# Patient Record
Sex: Female | Born: 1965 | Race: Black or African American | Hispanic: No | Marital: Single | Smoking: Current every day smoker
Health system: Southern US, Community
[De-identification: ages and names within clinical notes are randomized; demographics above are authoritative.]

## PROBLEM LIST (undated history)

## (undated) DIAGNOSIS — J45909 Unspecified asthma, uncomplicated: Secondary | ICD-10-CM

## (undated) DIAGNOSIS — F319 Bipolar disorder, unspecified: Secondary | ICD-10-CM

## (undated) DIAGNOSIS — N898 Other specified noninflammatory disorders of vagina: Secondary | ICD-10-CM

## (undated) DIAGNOSIS — F99 Mental disorder, not otherwise specified: Secondary | ICD-10-CM

## (undated) DIAGNOSIS — J449 Chronic obstructive pulmonary disease, unspecified: Secondary | ICD-10-CM

## (undated) DIAGNOSIS — R232 Flushing: Secondary | ICD-10-CM

## (undated) DIAGNOSIS — D219 Benign neoplasm of connective and other soft tissue, unspecified: Secondary | ICD-10-CM

## (undated) DIAGNOSIS — I10 Essential (primary) hypertension: Secondary | ICD-10-CM

## (undated) HISTORY — DX: Flushing: R23.2

## (undated) HISTORY — PX: COLONOSCOPY: SHX174

## (undated) HISTORY — DX: Bipolar disorder, unspecified: F31.9

## (undated) HISTORY — DX: Benign neoplasm of connective and other soft tissue, unspecified: D21.9

## (undated) HISTORY — PX: ABDOMINAL HYSTERECTOMY: SHX81

## (undated) HISTORY — DX: Mental disorder, not otherwise specified: F99

## (undated) HISTORY — DX: Unspecified asthma, uncomplicated: J45.909

## (undated) HISTORY — DX: Other specified noninflammatory disorders of vagina: N89.8

## (undated) HISTORY — DX: Chronic obstructive pulmonary disease, unspecified: J44.9

---

## 2001-12-12 ENCOUNTER — Emergency Department (HOSPITAL_COMMUNITY): Admission: EM | Admit: 2001-12-12 | Discharge: 2001-12-13 | Payer: Self-pay | Admitting: *Deleted

## 2002-01-02 ENCOUNTER — Ambulatory Visit (HOSPITAL_COMMUNITY): Admission: RE | Admit: 2002-01-02 | Discharge: 2002-01-02 | Payer: Self-pay | Admitting: Family Medicine

## 2002-01-02 ENCOUNTER — Encounter: Payer: Self-pay | Admitting: Family Medicine

## 2002-01-13 ENCOUNTER — Ambulatory Visit (HOSPITAL_COMMUNITY): Admission: RE | Admit: 2002-01-13 | Discharge: 2002-01-13 | Payer: Self-pay | Admitting: Cardiovascular Disease

## 2002-01-13 ENCOUNTER — Encounter: Payer: Self-pay | Admitting: Cardiovascular Disease

## 2004-03-05 ENCOUNTER — Ambulatory Visit (HOSPITAL_COMMUNITY): Admission: RE | Admit: 2004-03-05 | Discharge: 2004-03-05 | Payer: Self-pay | Admitting: Family Medicine

## 2005-11-06 ENCOUNTER — Ambulatory Visit (HOSPITAL_COMMUNITY): Admission: RE | Admit: 2005-11-06 | Discharge: 2005-11-06 | Payer: Self-pay | Admitting: Family Medicine

## 2007-01-28 ENCOUNTER — Ambulatory Visit (HOSPITAL_COMMUNITY): Admission: RE | Admit: 2007-01-28 | Discharge: 2007-01-28 | Payer: Self-pay | Admitting: Family Medicine

## 2007-02-01 ENCOUNTER — Ambulatory Visit (HOSPITAL_COMMUNITY): Admission: RE | Admit: 2007-02-01 | Discharge: 2007-02-01 | Payer: Self-pay | Admitting: Family Medicine

## 2007-03-08 ENCOUNTER — Ambulatory Visit (HOSPITAL_COMMUNITY): Admission: RE | Admit: 2007-03-08 | Discharge: 2007-03-08 | Payer: Self-pay | Admitting: Family Medicine

## 2009-08-30 ENCOUNTER — Ambulatory Visit (HOSPITAL_COMMUNITY): Admission: RE | Admit: 2009-08-30 | Discharge: 2009-08-30 | Payer: Self-pay | Admitting: Family Medicine

## 2010-05-27 ENCOUNTER — Ambulatory Visit (HOSPITAL_COMMUNITY)
Admission: RE | Admit: 2010-05-27 | Discharge: 2010-05-27 | Payer: Self-pay | Source: Home / Self Care | Attending: Family Medicine | Admitting: Family Medicine

## 2011-02-03 ENCOUNTER — Ambulatory Visit (HOSPITAL_COMMUNITY)
Admission: RE | Admit: 2011-02-03 | Discharge: 2011-02-03 | Disposition: A | Discharge status: Home / Self Care | Payer: BC Managed Care – PPO | Source: Ambulatory Visit | Attending: Family Medicine | Admitting: Family Medicine

## 2011-02-03 ENCOUNTER — Other Ambulatory Visit (HOSPITAL_COMMUNITY): Payer: Self-pay | Admitting: Family Medicine

## 2011-02-03 DIAGNOSIS — R05 Cough: Secondary | ICD-10-CM | POA: Insufficient documentation

## 2011-02-03 DIAGNOSIS — F17200 Nicotine dependence, unspecified, uncomplicated: Secondary | ICD-10-CM | POA: Insufficient documentation

## 2011-02-03 DIAGNOSIS — Z01419 Encounter for gynecological examination (general) (routine) without abnormal findings: Secondary | ICD-10-CM

## 2011-02-03 DIAGNOSIS — R059 Cough, unspecified: Secondary | ICD-10-CM | POA: Insufficient documentation

## 2011-07-28 ENCOUNTER — Inpatient Hospital Stay (HOSPITAL_COMMUNITY)
Admission: EM | Admit: 2011-07-28 | Discharge: 2011-07-30 | DRG: 640 | Disposition: A | Discharge status: Home / Self Care | Payer: Self-pay | Attending: Internal Medicine | Admitting: Internal Medicine

## 2011-07-28 ENCOUNTER — Emergency Department (HOSPITAL_COMMUNITY): Payer: Self-pay

## 2011-07-28 ENCOUNTER — Other Ambulatory Visit: Payer: Self-pay

## 2011-07-28 ENCOUNTER — Encounter (HOSPITAL_COMMUNITY): Payer: Self-pay | Admitting: *Deleted

## 2011-07-28 DIAGNOSIS — F17200 Nicotine dependence, unspecified, uncomplicated: Secondary | ICD-10-CM | POA: Diagnosis present

## 2011-07-28 DIAGNOSIS — R55 Syncope and collapse: Secondary | ICD-10-CM | POA: Diagnosis present

## 2011-07-28 DIAGNOSIS — N390 Urinary tract infection, site not specified: Secondary | ICD-10-CM | POA: Diagnosis present

## 2011-07-28 DIAGNOSIS — I10 Essential (primary) hypertension: Secondary | ICD-10-CM | POA: Diagnosis present

## 2011-07-28 DIAGNOSIS — W19XXXA Unspecified fall, initial encounter: Secondary | ICD-10-CM | POA: Diagnosis present

## 2011-07-28 DIAGNOSIS — R109 Unspecified abdominal pain: Secondary | ICD-10-CM | POA: Diagnosis present

## 2011-07-28 DIAGNOSIS — E860 Dehydration: Secondary | ICD-10-CM | POA: Diagnosis present

## 2011-07-28 DIAGNOSIS — S0003XA Contusion of scalp, initial encounter: Secondary | ICD-10-CM | POA: Diagnosis present

## 2011-07-28 DIAGNOSIS — S1093XA Contusion of unspecified part of neck, initial encounter: Secondary | ICD-10-CM | POA: Diagnosis present

## 2011-07-28 DIAGNOSIS — D72829 Elevated white blood cell count, unspecified: Secondary | ICD-10-CM | POA: Diagnosis present

## 2011-07-28 DIAGNOSIS — R000 Tachycardia, unspecified: Secondary | ICD-10-CM | POA: Diagnosis present

## 2011-07-28 DIAGNOSIS — R112 Nausea with vomiting, unspecified: Secondary | ICD-10-CM | POA: Diagnosis present

## 2011-07-28 DIAGNOSIS — E871 Hypo-osmolality and hyponatremia: Principal | ICD-10-CM | POA: Diagnosis present

## 2011-07-28 DIAGNOSIS — J189 Pneumonia, unspecified organism: Secondary | ICD-10-CM | POA: Diagnosis present

## 2011-07-28 HISTORY — DX: Essential (primary) hypertension: I10

## 2011-07-28 LAB — RAPID URINE DRUG SCREEN, HOSP PERFORMED
Amphetamines: NOT DETECTED
Barbiturates: NOT DETECTED
Benzodiazepines: NOT DETECTED
Cocaine: NOT DETECTED
Opiates: NOT DETECTED
Tetrahydrocannabinol: NOT DETECTED

## 2011-07-28 LAB — CARDIAC PANEL(CRET KIN+CKTOT+MB+TROPI)
CK, MB: 1.2 ng/mL (ref 0.3–4.0)
Relative Index: 0.5 (ref 0.0–2.5)
Total CK: 235 U/L — ABNORMAL HIGH (ref 7–177)

## 2011-07-28 LAB — URINALYSIS, ROUTINE W REFLEX MICROSCOPIC
Glucose, UA: 100 mg/dL — AB
Ketones, ur: 15 mg/dL — AB
Leukocytes, UA: NEGATIVE
Nitrite: POSITIVE — AB
Protein, ur: 100 mg/dL — AB
Specific Gravity, Urine: 1.01 (ref 1.005–1.030)
Urobilinogen, UA: 1 mg/dL (ref 0.0–1.0)
pH: 6.5 (ref 5.0–8.0)

## 2011-07-28 LAB — COMPREHENSIVE METABOLIC PANEL
ALT: 28 U/L (ref 0–35)
AST: 49 U/L — ABNORMAL HIGH (ref 0–37)
Albumin: 4 g/dL (ref 3.5–5.2)
Calcium: 10.7 mg/dL — ABNORMAL HIGH (ref 8.4–10.5)
Chloride: 90 mEq/L — ABNORMAL LOW (ref 96–112)
Creatinine, Ser: 0.71 mg/dL (ref 0.50–1.10)
GFR calc Af Amer: >90 mL/min (ref >90)
GFR calc non Af Amer: >90 mL/min (ref >90)
Sodium: 129 mEq/L — ABNORMAL LOW (ref 135–145)
Total Bilirubin: 0.8 mg/dL (ref 0.3–1.2)
Total Protein: 8.1 g/dL (ref 6.0–8.3)

## 2011-07-28 LAB — CBC
HCT: 42.1 % (ref 36.0–46.0)
Hemoglobin: 15 g/dL (ref 12.0–15.0)
MCH: 34.1 pg — ABNORMAL HIGH (ref 26.0–34.0)
MCHC: 35.6 g/dL (ref 30.0–36.0)
MCV: 95.7 fL (ref 78.0–100.0)
Platelets: 206 K/uL (ref 150–400)
RBC: 4.4 MIL/uL (ref 3.87–5.11)
RDW: 12.5 % (ref 11.5–15.5)
WBC: 15.5 K/uL — ABNORMAL HIGH (ref 4.0–10.5)

## 2011-07-28 LAB — URINE MICROSCOPIC-ADD ON

## 2011-07-28 LAB — DIFFERENTIAL
Basophils Absolute: 0 K/uL (ref 0.0–0.1)
Basophils Relative: 0 % (ref 0–1)
Eosinophils Absolute: 0 K/uL (ref 0.0–0.7)
Eosinophils Relative: 0 % (ref 0–5)
Lymphocytes Relative: 8 % — ABNORMAL LOW (ref 12–46)
Lymphs Abs: 1.3 K/uL (ref 0.7–4.0)
Monocytes Absolute: 1.3 K/uL — ABNORMAL HIGH (ref 0.1–1.0)
Monocytes Relative: 8 % (ref 3–12)
Neutro Abs: 12.9 K/uL — ABNORMAL HIGH (ref 1.7–7.7)
Neutrophils Relative %: 83 % — ABNORMAL HIGH (ref 43–77)

## 2011-07-28 LAB — D-DIMER, QUANTITATIVE: D-Dimer, Quant: 1.55 ug/mL-FEU — ABNORMAL HIGH (ref 0.00–0.48)

## 2011-07-28 MED ORDER — METOPROLOL TARTRATE 1 MG/ML IV SOLN
5.0000 mg | INTRAVENOUS | Status: DC | PRN
  Administered 2011-07-28: 5 mg via INTRAVENOUS
  Filled 2011-07-28: qty 5

## 2011-07-28 MED ORDER — SODIUM CHLORIDE 0.9 % IV BOLUS (SEPSIS)
1000.0000 mL | Freq: Once | INTRAVENOUS | Status: AC
  Administered 2011-07-28: 1000 mL via INTRAVENOUS

## 2011-07-28 MED ORDER — SODIUM CHLORIDE 0.9 % IV SOLN
INTRAVENOUS | Status: DC
  Administered 2011-07-28: 100 mL via INTRAVENOUS
  Administered 2011-07-30: 08:00:00 via INTRAVENOUS

## 2011-07-28 MED ORDER — ONDANSETRON HCL 4 MG/2ML IJ SOLN: 4.0000 mg | Freq: Four times a day (QID) | INTRAMUSCULAR | Status: DC | PRN

## 2011-07-28 MED ORDER — ASPIRIN EC 325 MG PO TBEC
325.0000 mg | DELAYED_RELEASE_TABLET | Freq: Every day | ORAL | Status: DC
  Administered 2011-07-28 – 2011-07-30 (×3): 325 mg via ORAL
  Filled 2011-07-28 (×3): qty 1

## 2011-07-28 MED ORDER — MORPHINE SULFATE 4 MG/ML IJ SOLN
4.0000 mg | Freq: Once | INTRAMUSCULAR | Status: AC
  Administered 2011-07-28: 4 mg via INTRAVENOUS
  Filled 2011-07-28: qty 1

## 2011-07-28 MED ORDER — SODIUM CHLORIDE 0.9 % IV SOLN: INTRAVENOUS | Status: AC

## 2011-07-28 MED ORDER — SODIUM CHLORIDE 0.9 % IV SOLN
Freq: Once | INTRAVENOUS | Status: AC
  Administered 2011-07-28: 16:00:00 via INTRAVENOUS

## 2011-07-28 MED ORDER — ACETAMINOPHEN 325 MG PO TABS
650.0000 mg | ORAL_TABLET | Freq: Four times a day (QID) | ORAL | Status: DC | PRN
  Administered 2011-07-28 – 2011-07-30 (×3): 650 mg via ORAL
  Filled 2011-07-28 (×3): qty 2

## 2011-07-28 MED ORDER — ONDANSETRON HCL 4 MG/2ML IJ SOLN
4.0000 mg | Freq: Once | INTRAMUSCULAR | Status: AC
  Administered 2011-07-28: 4 mg via INTRAVENOUS
  Filled 2011-07-28: qty 2

## 2011-07-28 MED ORDER — ALBUTEROL SULFATE HFA 108 (90 BASE) MCG/ACT IN AERS
2.0000 | INHALATION_SPRAY | Freq: Four times a day (QID) | RESPIRATORY_TRACT | Status: DC | PRN
  Filled 2011-07-28: qty 6.7

## 2011-07-28 MED ORDER — DEXTROSE 5 % IV SOLN
1.0000 g | INTRAVENOUS | Status: DC
  Administered 2011-07-28 – 2011-07-29 (×2): 1 g via INTRAVENOUS
  Filled 2011-07-28 (×3): qty 10

## 2011-07-28 MED ORDER — ENOXAPARIN SODIUM 40 MG/0.4ML ~~LOC~~ SOLN
40.0000 mg | SUBCUTANEOUS | Status: DC
  Administered 2011-07-28 – 2011-07-29 (×2): 40 mg via SUBCUTANEOUS
  Filled 2011-07-28 (×2): qty 0.4

## 2011-07-28 MED ORDER — POTASSIUM CHLORIDE CRYS ER 20 MEQ PO TBCR
40.0000 meq | EXTENDED_RELEASE_TABLET | Freq: Once | ORAL | Status: AC
  Administered 2011-07-28: 40 meq via ORAL
  Filled 2011-07-28: qty 2

## 2011-07-28 MED ORDER — ACETAMINOPHEN 650 MG RE SUPP: 650.0000 mg | Freq: Four times a day (QID) | RECTAL | Status: DC | PRN

## 2011-07-28 MED ORDER — MORPHINE SULFATE 2 MG/ML IJ SOLN: 2.0000 mg | INTRAMUSCULAR | Status: DC | PRN

## 2011-07-28 MED ORDER — ONDANSETRON HCL 4 MG PO TABS: 4.0000 mg | ORAL_TABLET | Freq: Four times a day (QID) | ORAL | Status: DC | PRN

## 2011-07-28 MED ORDER — DEXTROSE 5 % IV SOLN
INTRAVENOUS | Status: AC
  Filled 2011-07-28: qty 10

## 2011-07-28 NOTE — ED Provider Notes (Signed)
History   This chart was scribed for [employer]. Colon Branch, MD by Clarita Crane. The patient was seen in room APA08/APA08. Patient's care was started at 1221.    CSN: [id number]  Arrival date & time 07/28/11  1221   First MD Initiated Contact with Patient 07/28/11 1322      Chief Complaint  Patient presents with  . Loss of Consciousness    (Consider location/radiation/quality/duration/timing/severity/associated sxs/prior treatment) HPI Bethany Espinoza is a 46 y.o. female who presents to the Emergency Department complaining of 2 syncopal episode which occurred immediately following sudden onset of severe weakness 2 days ago. Reports she experienced one episode while in the bathroom and another several minutes later. Also notes experiencing chills for the past several days. Patient notes history of 1 syncopal episode previously. Additionally, patient notes experiencing cough for the past 2 weeks. Denies fever, nausea, vomiting, diarrhea, dizziness, HA. Patient with h/o HTN.   PCP- Phillips Odor   Past Medical History  Diagnosis Date  . Hypertension     Past Surgical History  Procedure Date  . Abdominal hysterectomy     History reviewed. No pertinent family history.  History  Substance Use Topics  . Smoking status: Current Everyday Smoker  . Smokeless tobacco: Not on file  . Alcohol Use: Yes    OB History    Grav Para Term Preterm Abortions TAB SAB Ect Mult Living                  Review of Systems 10 Systems reviewed and are negative for acute change except as noted in the HPI.  Allergies  Codeine  Home Medications   Current Outpatient Rx  Name Route Sig Dispense Refill  . ALBUTEROL SULFATE HFA 108 (90 BASE) MCG/ACT IN AERS Inhalation Inhale 2 puffs into the lungs every 6 (six) hours as needed. Shortness of breath    . GOODYS BODY PAIN PO Oral Take 1 packet by mouth daily as needed. Pain      BP 137/93  Pulse 148  Temp(Src) 99.9 F (37.7 C) (Oral)  Resp 20  Ht 5'  6" (1.676 m)  Wt 130 lb (58.968 kg)  BMI 20.98 kg/m2  SpO2 99%  Physical Exam  Nursing note and vitals reviewed. Constitutional: She is oriented to person, place, and time. She appears well-developed and well-nourished. No distress.  HENT:  Head: Normocephalic.       Bruising and swelling to the left periorbital area  Eyes: Conjunctivae and EOM are normal. Pupils are equal, round, and reactive to light.  Neck: Neck supple. No tracheal deviation present.  Cardiovascular: Regular rhythm.  Tachycardia present.  Exam reveals no gallop and no friction rub.   No murmur heard. Pulmonary/Chest: Effort normal. No respiratory distress. She has wheezes.       Crackles at bases and occasional end expiratory wheeze.   Abdominal: Soft. She exhibits no distension.  Musculoskeletal: Normal range of motion. She exhibits no edema.  Neurological: She is alert and oriented to person, place, and time. No sensory deficit.  Skin: Skin is warm and dry.  Psychiatric: She has a normal mood and affect. Her behavior is normal.    ED Course  Procedures (including critical care time)  DIAGNOSTIC STUDIES: Oxygen Saturation is 99% on room air, normal by my interpretation.    COORDINATION OF CARE: 1:40PM- Patient informed of current plan for treatment and evaluation and agrees with plan at this time.  2:40 Receiving IVF. HR 116.  3:40 Resting quietly.  Reviewed results with patient. HR has responded to IVF but is not remaining below 100. 4:45 Spoke with Dr. Kerry Hough, hospitalist who will admit the patient to telemetry. Temporary orders have been completed. Results for orders placed during the hospital encounter of 07/28/11  CBC      Component Value Range   WBC 15.5 (*) 4.0 - 10.5 (K/uL)   RBC 4.40  3.87 - 5.11 (MIL/uL)   Hemoglobin 15.0  12.0 - 15.0 (g/dL)   HCT 81.1  91.4 - 78.2 (%)   MCV 95.7  78.0 - 100.0 (fL)   MCH 34.1 (*) 26.0 - 34.0 (pg)   MCHC 35.6  30.0 - 36.0 (g/dL)   RDW 95.6  21.3 - 08.6 (%)    Platelets 206  150 - 400 (K/uL)  DIFFERENTIAL      Component Value Range   Neutrophils Relative 83 (*) 43 - 77 (%)   Neutro Abs 12.9 (*) 1.7 - 7.7 (K/uL)   Lymphocytes Relative 8 (*) 12 - 46 (%)   Lymphs Abs 1.3  0.7 - 4.0 (K/uL)   Monocytes Relative 8  3 - 12 (%)   Monocytes Absolute 1.3 (*) 0.1 - 1.0 (K/uL)   Eosinophils Relative 0  0 - 5 (%)   Eosinophils Absolute 0.0  0.0 - 0.7 (K/uL)   Basophils Relative 0  0 - 1 (%)   Basophils Absolute 0.0  0.0 - 0.1 (K/uL)  COMPREHENSIVE METABOLIC PANEL      Component Value Range   Sodium 129 (*) 135 - 145 (mEq/L)   Potassium 3.5  3.5 - 5.1 (mEq/L)   Chloride 90 (*) 96 - 112 (mEq/L)   CO2 24  19 - 32 (mEq/L)   Glucose, Bld 128 (*) 70 - 99 (mg/dL)   BUN 4 (*) 6 - 23 (mg/dL)   Creatinine, Ser 5.78  0.50 - 1.10 (mg/dL)   Calcium 46.9 (*) 8.4 - 10.5 (mg/dL)   Total Protein 8.1  6.0 - 8.3 (g/dL)   Albumin 4.0  3.5 - 5.2 (g/dL)   AST 49 (*) 0 - 37 (U/L)   ALT 28  0 - 35 (U/L)   Alkaline Phosphatase 78  39 - 117 (U/L)   Total Bilirubin 0.8  0.3 - 1.2 (mg/dL)   GFR calc non Af Amer >90  >90 (mL/min)   GFR calc Af Amer >90  >90 (mL/min)  URINALYSIS, ROUTINE W REFLEX MICROSCOPIC      Component Value Range   Color, Urine AMBER (*) YELLOW    APPearance CLEAR  CLEAR    Specific Gravity, Urine 1.010  1.005 - 1.030    pH 6.5  5.0 - 8.0    Glucose, UA 100 (*) NEGATIVE (mg/dL)   Hgb urine dipstick TRACE (*) NEGATIVE    Bilirubin Urine MODERATE (*) NEGATIVE    Ketones, ur 15 (*) NEGATIVE (mg/dL)   Protein, ur 629 (*) NEGATIVE (mg/dL)   Urobilinogen, UA 1.0  0.0 - 1.0 (mg/dL)   Nitrite POSITIVE (*) NEGATIVE    Leukocytes, UA NEGATIVE  NEGATIVE   URINE MICROSCOPIC-ADD ON      Component Value Range   Squamous Epithelial / LPF MANY (*) RARE    WBC, UA 3-6  <3 (WBC/hpf)   RBC / HPF 0-2  <3 (RBC/hpf)   Bacteria, UA FEW (*) RARE    Casts GRANULAR CAST (*) NEGATIVE     Date: 07/28/2011  1326   Rate: 132  Rhythm: sinus tachycardia  QRS Axis:  normal  Intervals: normal  ST/T Wave abnormalities: normal  Conduction Disutrbances:none  Narrative Interpretation:   Old EKG Reviewed: none available  Ct Head Wo Contrast  07/28/2011  *RADIOLOGY REPORT*  Clinical Data:  Loss of consciousness, fall 2 days ago, forehead injury, left frontal orbital bruising.  CT HEAD WITHOUT CONTRAST CT CERVICAL SPINE WITHOUT CONTRAST  Technique:  Multidetector CT imaging of the head and cervical spine was performed following the standard protocol without intravenous contrast.  Multiplanar CT image reconstructions of the cervical spine were also generated.  Comparison:   None  CT HEAD  Findings: Small to moderate left anterior frontal scalp hematoma. Slight soft tissue swelling.  No acute intracranial hemorrhage, infarction, mass lesion, midline shift, herniation, hydrocephalus, or extra-axial fluid collection.  Cisterns patent.  No cerebellar abnormality.  Orbits symmetric.  Mastoids and visualized sinuses clear.  IMPRESSION: Left frontal scalp hematoma.  No acute intracranial finding or underlying skull fracture.  CT CERVICAL SPINE  Findings: Normal cervical spine alignment.  Preserved vertebral body heights and disc spaces.  No significant degenerative disc disease and spondylosis.  Normal prevertebral soft tissues. Negative for fracture, compression deformity, or focal kyphosis. Very minor facet arthropathy on the right at C2-3.  IMPRESSION: No acute fracture or injury of the cervical spine by CT.  Original Report Authenticated By: Judie Petit. Ruel Favors, M.D.   Ct Cervical Spine Wo Contrast  07/28/2011  *RADIOLOGY REPORT*  Clinical Data:  Loss of consciousness, fall 2 days ago, forehead injury, left frontal orbital bruising.  CT HEAD WITHOUT CONTRAST CT CERVICAL SPINE WITHOUT CONTRAST  Technique:  Multidetector CT imaging of the head and cervical spine was performed following the standard protocol without intravenous contrast.  Multiplanar CT image reconstructions of the  cervical spine were also generated.  Comparison:   None  CT HEAD  Findings: Small to moderate left anterior frontal scalp hematoma. Slight soft tissue swelling.  No acute intracranial hemorrhage, infarction, mass lesion, midline shift, herniation, hydrocephalus, or extra-axial fluid collection.  Cisterns patent.  No cerebellar abnormality.  Orbits symmetric.  Mastoids and visualized sinuses clear.  IMPRESSION: Left frontal scalp hematoma.  No acute intracranial finding or underlying skull fracture.  CT CERVICAL SPINE  Findings: Normal cervical spine alignment.  Preserved vertebral body heights and disc spaces.  No significant degenerative disc disease and spondylosis.  Normal prevertebral soft tissues. Negative for fracture, compression deformity, or focal kyphosis. Very minor facet arthropathy on the right at C2-3.  IMPRESSION: No acute fracture or injury of the cervical spine by CT.  Original Report Authenticated By: Judie Petit. Ruel Favors, M.D.   Dg Chest Port 1 View  07/28/2011  *RADIOLOGY REPORT*  Clinical Data: Syncopal episode 3 days ago.  PORTABLE CHEST - 1 VIEW  Comparison: 02/03/2011.  Findings: Minimal peribronchial thickening.  No segmental consolidation or pulmonary edema.  Pleural reflection associated undersurface left second rib.  No gross pneumothorax.  Heart size within normal limits.  IMPRESSION: Peribronchial thickening stable.  Original Report Authenticated By: Fuller Canada, M.D.           MDM  Patient with recent syncopal episodes x 2. Here with tachycardia. Labs showing hyponatremia, urine nitrite  positive with moderate bilirubin and no h/o vomiting.Chest xray without bronchitic changes or pna despite a harsh cough. Remains tachycardic despite 3 liters IVF. Will arrange for admission for further work up of both tachycardia and syncope.Pt stable in ED with no significant deterioration in condition.The patient appears reasonably stabilized for admission considering the current  resources, flow,  and capabilities available in the ED at this time, and I doubt any other [facility] requiring further screening and/or treatment in the ED prior to admission.  I personally performed the services described in this documentation, which was scribed in my presence. The recorded information has been reviewed and considered.   MDM Reviewed: nursing note and vitals Interpretation: labs, ECG, x-ray and CT scan  consult: hospitalist         Nicoletta Dress. Colon Branch, MD 07/28/11 1651

## 2011-07-28 NOTE — H&P (Addendum)
PCP:   Colette Ribas, MD, MD   Chief Complaint:  syncope  HPI: This is a 46 year old black female with past medical history of hypertension who was brought to the emergency room after a syncopal episode. Syncopal episodes occurred approximately 2 days ago. Patient reports that on Sunday she was in her bathroom started to feel increasingly weak and passed out. She then woke up went to the kitchen to try to drink some soda. The next thing she new she was lying on the floor and was soda all over her. She does not actually remember falling and hitting the ground. She did suffer a hematoma to her frontal scalp. She does also have ecchymosis on her left orbit. Patient reports that these symptoms have occurred in the past as well. She has not sought out any medical treatment since she fell these resolve on their own. She reports of having a chest pain that is constant across her entire chest. She's had this pain for approximately 2 weeks now. He was reports that his resuming coughing. She also has some nausea and had some vomiting yesterday as well. She denies any diarrhea. She's not sure whether she's had a fever but is experiencing chills. Evaluation in the ER, CT scan of the brain did not reveal any intracranial pathology, there was a left frontal scalp hematoma. She was noted to be present and tachycardic in the 140s. Her heart rate has been fluctuating between 80s to 90s to 140s 150s. She is in a sinus rhythm. Was also noted that her urine was quite GERD. She's received a total of 2 L of normal saline and she's currently on her third liter at this time. She's not had any hypotension, she's not been febrile. Remainder of the workup was relatively unremarkable. Patient has been referred for admission. Her family members note that she may be having some mild confusion  Allergies:   Allergies  Allergen Reactions  . Codeine Hives and Rash    Not in right state of mind      Past Medical History    Diagnosis Date  . Hypertension     Past Surgical History  Procedure Date  . Abdominal hysterectomy     Prior to Admission medications   Medication Sig Start Date End Date Taking? Authorizing Provider  albuterol (PROVENTIL HFA;VENTOLIN HFA) 108 (90 BASE) MCG/ACT inhaler Inhale 2 puffs into the lungs every 6 (six) hours as needed. Shortness of breath   Yes Historical Provider, MD  Aspirin-Acetaminophen (GOODYS BODY PAIN PO) Take 1 packet by mouth daily as needed. Pain   Yes Historical Provider, MD    Social History:  reports that she has been smoking.  She does not have any smokeless tobacco history on file. She reports that she drinks alcohol. She reports that she does not use illicit drugs.  History reviewed. No pertinent family history.  Review of Systems:  Constitutional: Denies fever, chills, diaphoresis, appetite change and fatigue.  HEENT: Denies photophobia, eye pain, redness, hearing loss, ear pain, congestion, sore throat, rhinorrhea, sneezing, mouth sores, trouble swallowing, neck pain, neck stiffness and tinnitus.   Respiratory: Denies SOB, DOE, cough, chest tightness,  and wheezing.   Cardiovascular: Denies chest pain, palpitations and leg swelling.  Gastrointestinal: Denies nausea, vomiting, abdominal pain, diarrhea, constipation, blood in stool and abdominal distention.  Genitourinary: Denies dysuria, urgency, frequency, hematuria, flank pain and difficulty urinating.  Musculoskeletal: Denies myalgias, back pain, joint swelling, arthralgias and gait problem.  Skin: Denies pallor, rash and wound.  Neurological: Denies dizziness, seizures, syncope, weakness, light-headedness, numbness and headaches.  Hematological: Denies adenopathy. Easy bruising, personal or family bleeding history  Psychiatric/Behavioral: Denies suicidal ideation, mood changes, confusion, nervousness, sleep disturbance and agitation   Physical Exam: Blood pressure 151/81, pulse 70, temperature 99.9  F (37.7 C), temperature source Oral, resp. rate 24, height 5\' 6"  (1.676 m), weight 58.968 kg (130 lb), SpO2 97.00%. General: Patient is lying in bed, does not appear to be in any acute distress, she is alert oriented x3 HEENT: Normocephalic, there is ecchymosis around her left orbit, mucous membranes are dry, Neck: Supple Chest: Mild bilateral rhonchi Cardiac: S1, S2, tachycardic Abdomen: Soft, mildly tender diffusely, bowel sounds are active in Extremities: No cyanosis, clubbing, or edema Neurologic: Grossly intact, nonfocal   Labs on Admission:  Results for orders placed during the hospital encounter of 07/28/11 (from the past 48 hour(s))  CBC     Status: Abnormal   Collection Time   07/28/11  1:44 PM      Component Value Range Comment   WBC 15.5 (*) 4.0 - 10.5 (K/uL)    RBC 4.40  3.87 - 5.11 (MIL/uL)    Hemoglobin 15.0  12.0 - 15.0 (g/dL)    HCT 40.9  81.1 - 91.4 (%)    MCV 95.7  78.0 - 100.0 (fL)    MCH 34.1 (*) 26.0 - 34.0 (pg)    MCHC 35.6  30.0 - 36.0 (g/dL)    RDW 78.2  95.6 - 21.3 (%)    Platelets 206  150 - 400 (K/uL)   DIFFERENTIAL     Status: Abnormal   Collection Time   07/28/11  1:44 PM      Component Value Range Comment   Neutrophils Relative 83 (*) 43 - 77 (%)    Neutro Abs 12.9 (*) 1.7 - 7.7 (K/uL)    Lymphocytes Relative 8 (*) 12 - 46 (%)    Lymphs Abs 1.3  0.7 - 4.0 (K/uL)    Monocytes Relative 8  3 - 12 (%)    Monocytes Absolute 1.3 (*) 0.1 - 1.0 (K/uL)    Eosinophils Relative 0  0 - 5 (%)    Eosinophils Absolute 0.0  0.0 - 0.7 (K/uL)    Basophils Relative 0  0 - 1 (%)    Basophils Absolute 0.0  0.0 - 0.1 (K/uL)   COMPREHENSIVE METABOLIC PANEL     Status: Abnormal   Collection Time   07/28/11  1:44 PM      Component Value Range Comment   Sodium 129 (*) 135 - 145 (mEq/L)    Potassium 3.5  3.5 - 5.1 (mEq/L)    Chloride 90 (*) 96 - 112 (mEq/L)    CO2 24  19 - 32 (mEq/L)    Glucose, Bld 128 (*) 70 - 99 (mg/dL)    BUN 4 (*) 6 - 23 (mg/dL)    Creatinine, Ser  0.86  0.50 - 1.10 (mg/dL)    Calcium 57.8 (*) 8.4 - 10.5 (mg/dL)    Total Protein 8.1  6.0 - 8.3 (g/dL)    Albumin 4.0  3.5 - 5.2 (g/dL)    AST 49 (*) 0 - 37 (U/L)    ALT 28  0 - 35 (U/L)    Alkaline Phosphatase 78  39 - 117 (U/L)    Total Bilirubin 0.8  0.3 - 1.2 (mg/dL)    GFR calc non Af Amer >90  >90 (mL/min)    GFR calc Af Amer >90  >  90 (mL/min)   ETHANOL     Status: Normal   Collection Time   07/28/11  1:44 PM      Component Value Range Comment   Alcohol, Ethyl (B) <11  0 - 11 (mg/dL)   URINALYSIS, ROUTINE W REFLEX MICROSCOPIC     Status: Abnormal   Collection Time   07/28/11  1:50 PM      Component Value Range Comment   Color, Urine AMBER (*) YELLOW  BIOCHEMICALS MAY BE AFFECTED BY COLOR   APPearance CLEAR  CLEAR     Specific Gravity, Urine 1.010  1.005 - 1.030     pH 6.5  5.0 - 8.0     Glucose, UA 100 (*) NEGATIVE (mg/dL)    Hgb urine dipstick TRACE (*) NEGATIVE     Bilirubin Urine MODERATE (*) NEGATIVE     Ketones, ur 15 (*) NEGATIVE (mg/dL)    Protein, ur 784 (*) NEGATIVE (mg/dL)    Urobilinogen, UA 1.0  0.0 - 1.0 (mg/dL)    Nitrite POSITIVE (*) NEGATIVE     Leukocytes, UA NEGATIVE  NEGATIVE    URINE MICROSCOPIC-ADD ON     Status: Abnormal   Collection Time   07/28/11  1:50 PM      Component Value Range Comment   Squamous Epithelial / LPF MANY (*) RARE     WBC, UA 3-6  <3 (WBC/hpf)    RBC / HPF 0-2  <3 (RBC/hpf)    Bacteria, UA FEW (*) RARE     Casts GRANULAR CAST (*) NEGATIVE    URINE RAPID DRUG SCREEN (HOSP PERFORMED)     Status: Normal   Collection Time   07/28/11  1:50 PM      Component Value Range Comment   Opiates NONE DETECTED  NONE DETECTED     Cocaine NONE DETECTED  NONE DETECTED     Benzodiazepines NONE DETECTED  NONE DETECTED     Amphetamines NONE DETECTED  NONE DETECTED     Tetrahydrocannabinol NONE DETECTED  NONE DETECTED     Barbiturates NONE DETECTED  NONE DETECTED      Radiological Exams on Admission: Ct Head Wo Contrast  07/28/2011  *RADIOLOGY  REPORT*  Clinical Data:  Loss of consciousness, fall 2 days ago, forehead injury, left frontal orbital bruising.  CT HEAD WITHOUT CONTRAST CT CERVICAL SPINE WITHOUT CONTRAST  Technique:  Multidetector CT imaging of the head and cervical spine was performed following the standard protocol without intravenous contrast.  Multiplanar CT image reconstructions of the cervical spine were also generated.  Comparison:   None  CT HEAD  Findings: Small to moderate left anterior frontal scalp hematoma. Slight soft tissue swelling.  No acute intracranial hemorrhage, infarction, mass lesion, midline shift, herniation, hydrocephalus, or extra-axial fluid collection.  Cisterns patent.  No cerebellar abnormality.  Orbits symmetric.  Mastoids and visualized sinuses clear.  IMPRESSION: Left frontal scalp hematoma.  No acute intracranial finding or underlying skull fracture.  CT CERVICAL SPINE  Findings: Normal cervical spine alignment.  Preserved vertebral body heights and disc spaces.  No significant degenerative disc disease and spondylosis.  Normal prevertebral soft tissues. Negative for fracture, compression deformity, or focal kyphosis. Very minor facet arthropathy on the right at C2-3.  IMPRESSION: No acute fracture or injury of the cervical spine by CT.  Original Report Authenticated By: Judie Petit. Ruel Favors, M.D.   Ct Cervical Spine Wo Contrast  07/28/2011  *RADIOLOGY REPORT*  Clinical Data:  Loss of consciousness, fall 2 days ago, forehead  injury, left frontal orbital bruising.  CT HEAD WITHOUT CONTRAST CT CERVICAL SPINE WITHOUT CONTRAST  Technique:  Multidetector CT imaging of the head and cervical spine was performed following the standard protocol without intravenous contrast.  Multiplanar CT image reconstructions of the cervical spine were also generated.  Comparison:   None  CT HEAD  Findings: Small to moderate left anterior frontal scalp hematoma. Slight soft tissue swelling.  No acute intracranial hemorrhage, infarction,  mass lesion, midline shift, herniation, hydrocephalus, or extra-axial fluid collection.  Cisterns patent.  No cerebellar abnormality.  Orbits symmetric.  Mastoids and visualized sinuses clear.  IMPRESSION: Left frontal scalp hematoma.  No acute intracranial finding or underlying skull fracture.  CT CERVICAL SPINE  Findings: Normal cervical spine alignment.  Preserved vertebral body heights and disc spaces.  No significant degenerative disc disease and spondylosis.  Normal prevertebral soft tissues. Negative for fracture, compression deformity, or focal kyphosis. Very minor facet arthropathy on the right at C2-3.  IMPRESSION: No acute fracture or injury of the cervical spine by CT.  Original Report Authenticated By: Judie Petit. Ruel Favors, M.D.   Dg Chest Port 1 View  07/28/2011  *RADIOLOGY REPORT*  Clinical Data: Syncopal episode 3 days ago.  PORTABLE CHEST - 1 VIEW  Comparison: 02/03/2011.  Findings: Minimal peribronchial thickening.  No segmental consolidation or pulmonary edema.  Pleural reflection associated undersurface left second rib.  No gross pneumothorax.  Heart size within normal limits.  IMPRESSION: Peribronchial thickening stable.  Original Report Authenticated By: Fuller Canada, M.D.    Assessment/Plan Principal Problem:  *Syncope and collapse Active Problems:  Tachycardia  Hyponatremia  Dehydration  Leukocytosis  UTI (lower urinary tract infection)  Plan:  #1 syncope etiology is not exactly clear. Patient does have some signs of an dehydration. She has received an adequate amount of IV fluids here in the emergency room, yet she still having significant tachycardia. The recurrent nature of her syncope and irregular heart rate does report concern for underlying cardiac arrhythmia. Patient was monitored closely on telemetry. We will check thyroid studies, d-dimer, cardiac enzymes. We'll also check a 2-D echocardiogram. We will ask for cardiology consultation in the morning to see if any  further electrophysiology workup is needed at this time. We will continue to hydrate the patient. Check orthostatics.   #2 tachycardia, possibly reactive 2 dehydration. We'll continue with hydration and use when necessary Lopressor for extreme tachycardia. Her blood pressure is stable. EKG shows sinus tachycardia. Replace potassium, check magnesium.  #3 hyponatremia likely related to volume depletion, repeat labs in the morning.  #4 leukocytosis, likely reactive, repeat labs in the morning after hydration  #5. Possible urinary tract infection. Patient does have nitrites positive on her urinalysis. We'll check a urine culture and started Rocephin.  #6. Mild confusion. Unknown etiology. This may be from an underlying urinary tract infection, also may be from a concussion from the patient's recent head trauma. We'll do frequent neuro checks and monitor closely.  Due to the patient's extreme tachycardia and unpredictable heart rate, she'll be monitored in the step down unit. Further orders will clinical course.  Time Spent on Admission:  Elizabelle Fite [facility] Pager: [phone] 07/28/2011, 5:37 PM

## 2011-07-28 NOTE — ED Notes (Signed)
Pt for Stepdown instead of Telemetry per Dr. Kerry Hough. Bed placement called and made aware of change.

## 2011-07-28 NOTE — ED Notes (Signed)
"  passed out" on Sunday in bath room contusion to face and lt periorbital area.  Has hx of passing out in past, but does not know why.  Feels weak.  Chest hurting, cough ,back hurts.

## 2011-07-28 NOTE — Progress Notes (Signed)
2200-applied nasal cannula for cough and chest pain from coughing and sats in low 90's.

## 2011-07-28 NOTE — Progress Notes (Signed)
Called Dr. Marvel Plan office and left a message on answering service regarding consult and also added him to the treatment team.

## 2011-07-28 NOTE — ED Notes (Signed)
Pt up to br and c/o dizziness. Pt back to bed.

## 2011-07-28 NOTE — ED Notes (Signed)
Placed pt on cardiac monitor.  ST on monitor.

## 2011-07-28 NOTE — ED Notes (Signed)
Monitor reading trigeminy PVC HR 138-144. Slight confusion noted. Dr. Kerry Hough made aware.

## 2011-07-28 NOTE — ED Notes (Signed)
Dr. Kerry Hough in room assessing pt for admission

## 2011-07-29 ENCOUNTER — Inpatient Hospital Stay (HOSPITAL_COMMUNITY): Payer: Self-pay

## 2011-07-29 DIAGNOSIS — R55 Syncope and collapse: Secondary | ICD-10-CM

## 2011-07-29 LAB — CBC
Hemoglobin: 11.8 g/dL — ABNORMAL LOW (ref 12.0–15.0)
MCHC: 35.6 g/dL (ref 30.0–36.0)
WBC: 13.4 10*3/uL — ABNORMAL HIGH (ref 4.0–10.5)

## 2011-07-29 LAB — URINE CULTURE
Colony Count: 9000
Culture  Setup Time: 201303060150

## 2011-07-29 LAB — CARDIAC PANEL(CRET KIN+CKTOT+MB+TROPI)
CK, MB: 1.7 ng/mL (ref 0.3–4.0)
Relative Index: 0.7 (ref 0.0–2.5)
Troponin I: <0.3 ng/mL (ref <0.30)

## 2011-07-29 LAB — BASIC METABOLIC PANEL
BUN: 4 mg/dL — ABNORMAL LOW (ref 6–23)
GFR calc Af Amer: >90 mL/min (ref >90)
GFR calc non Af Amer: >90 mL/min (ref >90)
Potassium: 3.3 mEq/L — ABNORMAL LOW (ref 3.5–5.1)
Sodium: 131 mEq/L — ABNORMAL LOW (ref 135–145)

## 2011-07-29 LAB — TSH: TSH: 2.969 u[IU]/mL (ref 0.350–4.500)

## 2011-07-29 MED ORDER — PNEUMOCOCCAL VAC POLYVALENT 25 MCG/0.5ML IJ INJ
0.5000 mL | INJECTION | INTRAMUSCULAR | Status: AC
  Administered 2011-07-30: 0.5 mL via INTRAMUSCULAR
  Filled 2011-07-29 (×2): qty 0.5

## 2011-07-29 MED ORDER — POTASSIUM CHLORIDE CRYS ER 20 MEQ PO TBCR
40.0000 meq | EXTENDED_RELEASE_TABLET | Freq: Once | ORAL | Status: AC
  Administered 2011-07-29: 40 meq via ORAL
  Filled 2011-07-29: qty 2

## 2011-07-29 MED ORDER — DEXTROSE 5 % IV SOLN
500.0000 mg | INTRAVENOUS | Status: DC
  Administered 2011-07-29: 500 mg via INTRAVENOUS
  Filled 2011-07-29 (×3): qty 500

## 2011-07-29 MED ORDER — GUAIFENESIN ER 600 MG PO TB12
1200.0000 mg | ORAL_TABLET | Freq: Two times a day (BID) | ORAL | Status: DC
  Administered 2011-07-29 – 2011-07-30 (×2): 1200 mg via ORAL
  Filled 2011-07-29 (×2): qty 2

## 2011-07-29 NOTE — Progress Notes (Signed)
*  PRELIMINARY RESULTS* Echocardiogram 2D Echocardiogram has been performed.  Conrad Como 07/29/2011, 2:13 PM

## 2011-07-29 NOTE — Consult Note (Signed)
CARDIOLOGY CONSULT NOTE  Patient ID: KYMIAH ARAIZA MRN: [id number] DOB/AGE: 1965-10-06 46 y.o.  Admit date: 07/28/2011 Referring Physician: Hospitalist Primary Physician: Colette Ribas, MD, MD Primary Cardiologist:  None Reason for Consultation: Syncope  Principal Problem:  *Syncope and collapse Active Problems:  Tachycardia  Hyponatremia  Dehydration  Leukocytosis  UTI (lower urinary tract infection)   HPI:  This is a 46 year old black female with past medical history of hypertension who was brought to the emergency room after a syncopal episode. Syncopal episodes occurred approximately 4 days ago on Sunday. She was in her bathroom started to feel increasingly weak and passed out. She then woke up went to the kitchen to try to drink some soda. The next thing she new she was lying on the floor and was soda all over her. She does not actually remember falling and hitting the ground. She did suffer a hematoma to her frontal scalp. She does also have ecchymosis on her left orbit. She came to the hospital only because her left eye had visual blurring.  Patient reports that these symptoms have occurred in the past as well. She has not sought out any medical treatment since she fell these resolve on their own. She also has no insurance and got laid off from [employer].  She reports of having a chest pain that is constant across her entire chest. She's had this pain for approximately 2 weeks now. He was reports that his resuming coughing. She also has some nausea and had some vomiting yesterday as well. She denies any diarrhea. She's not sure whether she's had a fever but is experiencing chills. Evaluation in the ER, CT scan of the brain did not reveal any intracranial pathology, there was a left frontal scalp hematoma. She was noted to be present and tachycardic in the 140s. Her heart rate has been fluctuating between 80s to 90s to 140s 150s. She is in a sinus rhythm. Was also noted that her urine was  quite GERD. She's received a total of 2 L of normal saline and she's currently on her third liter at this time. She's not had any hypotension, she's not been febrile. Remainder of the workup was relatively unremarkable. Patient has been referred for admission. Her family members note that she may be having some mild confusion   @ROS @ All other systems reviewed and negative except as noted above  Past Medical History  Diagnosis Date  . Hypertension     History reviewed. No pertinent family history.  History   Social History  . Marital Status: Single    Spouse Name: N/A    Number of Children: N/A  . Years of Education: N/A   Occupational History  . Not on file.   Social History Main Topics  . Smoking status: Current Everyday Smoker -- 0.5 packs/day for 15 years  . Smokeless tobacco: Not on file  . Alcohol Use: 1.8 oz/week    3 Cans of beer per week  . Drug Use: No  . Sexually Active: No   Other Topics Concern  . Not on file   Social History Narrative  . No narrative on file    Past Surgical History  Procedure Date  . Abdominal hysterectomy         . sodium chloride   Intravenous Once  . sodium chloride   Intravenous STAT  . aspirin EC  325 mg Oral Daily  . cefTRIAXone (ROCEPHIN)  IV  1 g Intravenous Q24H  . enoxaparin  40  mg Subcutaneous Q24H  .  morphine injection  4 mg Intravenous Once  . ondansetron  4 mg Intravenous Once  . ondansetron  4 mg Intravenous Once  . potassium chloride  40 mEq Oral Once  . sodium chloride  1,000 mL Intravenous Once  . sodium chloride  1,000 mL Intravenous Once  . sodium chloride  1,000 mL Intravenous Once      . sodium chloride 100 mL/hr at 07/29/11 0800    Physical Exam: Blood pressure 118/81, pulse 93, temperature 98.3 F (36.8 C), temperature source Oral, resp. rate 20, height 5\' 6"  (1.676 m), weight 61.4 kg (135 lb 5.8 oz), SpO2 98.00%.    Affect appropriate Healthy:  appears stated age HEENT: normal echymosis on  forehead Neck supple with no adenopathy JVP normal no bruits no thyromegaly Lungs clear with no wheezing and good diaphragmatic motion Heart:  S1/S2 no murmur, no rub, gallop or click PMI normal Abdomen: patient would not let me examine Says she is tender no bruit.  No HSM or HJR Distal pulses intact with no bruits No edema Neuro non-focal Skin warm and dry No muscular weakness   Labs:   Lab Results  Component Value Date   WBC 13.4* 07/29/2011   HGB 11.8* 07/29/2011   HCT 33.1* 07/29/2011   MCV 95.9 07/29/2011   PLT 168 07/29/2011    Lab 07/29/11 0216 07/28/11 1344  NA 131* --  K 3.3* --  CL 99 --  CO2 21 --  BUN 4* --  CREATININE 0.64 --  CALCIUM 8.8 --  PROT -- 8.1  BILITOT -- 0.8  ALKPHOS -- 78  ALT -- 28  AST -- 49*  GLUCOSE 125* --      Radiology: Ct Head Wo Contrast  07/28/2011  *RADIOLOGY REPORT*  Clinical Data:  Loss of consciousness, fall 2 days ago, forehead injury, left frontal orbital bruising.  CT HEAD WITHOUT CONTRAST CT CERVICAL SPINE WITHOUT CONTRAST  Technique:  Multidetector CT imaging of the head and cervical spine was performed following the standard protocol without intravenous contrast.  Multiplanar CT image reconstructions of the cervical spine were also generated.  Comparison:   None  CT HEAD  Findings: Small to moderate left anterior frontal scalp hematoma. Slight soft tissue swelling.  No acute intracranial hemorrhage, infarction, mass lesion, midline shift, herniation, hydrocephalus, or extra-axial fluid collection.  Cisterns patent.  No cerebellar abnormality.  Orbits symmetric.  Mastoids and visualized sinuses clear.  IMPRESSION: Left frontal scalp hematoma.  No acute intracranial finding or underlying skull fracture.  CT CERVICAL SPINE  Findings: Normal cervical spine alignment.  Preserved vertebral body heights and disc spaces.  No significant degenerative disc disease and spondylosis.  Normal prevertebral soft tissues. Negative for fracture,  compression deformity, or focal kyphosis. Very minor facet arthropathy on the right at C2-3.  IMPRESSION: No acute fracture or injury of the cervical spine by CT.  Original Report Authenticated By: Judie Petit. Ruel Favors, M.D.   Ct Cervical Spine Wo Contrast  07/28/2011  *RADIOLOGY REPORT*  Clinical Data:  Loss of consciousness, fall 2 days ago, forehead injury, left frontal orbital bruising.  CT HEAD WITHOUT CONTRAST CT CERVICAL SPINE WITHOUT CONTRAST  Technique:  Multidetector CT imaging of the head and cervical spine was performed following the standard protocol without intravenous contrast.  Multiplanar CT image reconstructions of the cervical spine were also generated.  Comparison:   None  CT HEAD  Findings: Small to moderate left anterior frontal scalp hematoma. Slight soft tissue  swelling.  No acute intracranial hemorrhage, infarction, mass lesion, midline shift, herniation, hydrocephalus, or extra-axial fluid collection.  Cisterns patent.  No cerebellar abnormality.  Orbits symmetric.  Mastoids and visualized sinuses clear.  IMPRESSION: Left frontal scalp hematoma.  No acute intracranial finding or underlying skull fracture.  CT CERVICAL SPINE  Findings: Normal cervical spine alignment.  Preserved vertebral body heights and disc spaces.  No significant degenerative disc disease and spondylosis.  Normal prevertebral soft tissues. Negative for fracture, compression deformity, or focal kyphosis. Very minor facet arthropathy on the right at C2-3.  IMPRESSION: No acute fracture or injury of the cervical spine by CT.  Original Report Authenticated By: Judie Petit. Ruel Favors, M.D.   Dg Chest Port 1 View  07/28/2011  *RADIOLOGY REPORT*  Clinical Data: Syncopal episode 3 days ago.  PORTABLE CHEST - 1 VIEW  Comparison: 02/03/2011.  Findings: Minimal peribronchial thickening.  No segmental consolidation or pulmonary edema.  Pleural reflection associated undersurface left second rib.  No gross pneumothorax.  Heart size within  normal limits.  IMPRESSION: Peribronchial thickening stable.  Original Report Authenticated By: Fuller Canada, M.D.    EKG: NSR rate 87 normal  Also normal on admission Telemetry:  NSR no arrhythmia    ASSESSMENT AND PLAN:  "Syncope"-   Vague history.  Unlikely to be cardiac in nature with normal exam and normal ECG.  No problems on telemetry to date.  Agree with echo.  If normal echo no further w/u.   Abdominal Pain:  With biliribin in urine needs further w/u Head Trauma:  Monitor for concussive symtoms no bleed on CT Smoking:  Counseled for less than 10 minutes on cessation Smoking cessation consult  Signed: Charlton Haws 07/29/2011, 8:26 AM

## 2011-07-29 NOTE — Progress Notes (Signed)
Subjective: Feels better today, a little light headed on standing, no diarrhea, no vomiting, has some periumbilical abdominal pain, has also been having a productive cough.  Objective: Vital signs in last 24 hours: Temp:  [97.9 F (36.6 C)-102.2 F (39 C)] 98.2 F (36.8 C) (03/06 0800) Pulse Rate:  [48-148] 94  (03/06 0800) Resp:  [16-29] 20  (03/06 0800) BP: (104-151)/(64-97) 111/73 mmHg (03/06 0800) SpO2:  [90 %-100 %] 98 % (03/06 0800) Weight:  [56.2 kg (123 lb 14.4 oz)-61.4 kg (135 lb 5.8 oz)] 61.4 kg (135 lb 5.8 oz) (03/06 0400) Weight change:  Last BM Date: 07/28/11  Intake/Output from previous day: 03/05 0701 - 03/06 0700 In: 50 [IV Piggyback:50] Out: 1350 [Urine:1350] Total I/O In: 1585 [P.O.:360; I.V.:1225] Out: 300 [Urine:300]   Physical Exam: General: Alert, awake, oriented x3, in no acute distress. HEENT: No bruits, no goiter. Heart: Regular rate and rhythm, without murmurs, rubs, gallops. Lungs: Clear to auscultation bilaterally. Abdomen: Soft, tender in periumbilical region on left, nondistended, positive bowel sounds. Extremities: No clubbing cyanosis or edema with positive pedal pulses. Neuro: Grossly intact, nonfocal.    Lab Results: Basic Metabolic Panel:  Basename 07/29/11 0216 07/28/11 1344  NA 131* 129*  K 3.3* 3.5  CL 99 90*  CO2 21 24  GLUCOSE 125* 128*  BUN 4* 4*  CREATININE 0.64 0.71  CALCIUM 8.8 10.7*  MG -- 1.9  PHOS -- --   Liver Function Tests:  Basename 07/28/11 1344  AST 49*  ALT 28  ALKPHOS 78  BILITOT 0.8  PROT 8.1  ALBUMIN 4.0   No results found for this basename: LIPASE:2,AMYLASE:2 in the last 72 hours No results found for this basename: AMMONIA:2 in the last 72 hours CBC:  Basename 07/29/11 0216 07/28/11 1344  WBC 13.4* 15.5*  NEUTROABS -- 12.9*  HGB 11.8* 15.0  HCT 33.1* 42.1  MCV 95.9 95.7  PLT 168 206   Cardiac Enzymes:  Basename 07/29/11 0216 07/28/11 1940  CKTOTAL 202* 235*  CKMB 1.4 1.2  CKMBINDEX  -- --  TROPONINI <0.30 <0.30   BNP: No results found for this basename: PROBNP:3 in the last 72 hours D-Dimer:  Alvira Philips 07/28/11 1940  DDIMER 1.55*   CBG: No results found for this basename: GLUCAP:6 in the last 72 hours Hemoglobin A1C: No results found for this basename: HGBA1C in the last 72 hours Fasting Lipid Panel: No results found for this basename: CHOL,HDL,LDLCALC,TRIG,CHOLHDL,LDLDIRECT in the last 72 hours Thyroid Function Tests: No results found for this basename: TSH,T4TOTAL,FREET4,T3FREE,THYROIDAB in the last 72 hours Anemia Panel: No results found for this basename: VITAMINB12,FOLATE,FERRITIN,TIBC,IRON,RETICCTPCT in the last 72 hours Coagulation: No results found for this basename: LABPROT:2,INR:2 in the last 72 hours Urine Drug Screen: Drugs of Abuse     Component Value Date/Time   LABOPIA NONE DETECTED 07/28/2011 1350   COCAINSCRNUR NONE DETECTED 07/28/2011 1350   LABBENZ NONE DETECTED 07/28/2011 1350   AMPHETMU NONE DETECTED 07/28/2011 1350   THCU NONE DETECTED 07/28/2011 1350   LABBARB NONE DETECTED 07/28/2011 1350    Alcohol Level:  Basename 07/28/11 1344  ETH <11   Urinalysis:  Basename 07/28/11 1350  COLORURINE AMBER*  LABSPEC 1.010  PHURINE 6.5  GLUCOSEU 100*  HGBUR TRACE*  BILIRUBINUR MODERATE*  KETONESUR 15*  PROTEINUR 100*  UROBILINOGEN 1.0  NITRITE POSITIVE*  LEUKOCYTESUR NEGATIVE    Recent Results (from the past 240 hour(s))  MRSA PCR SCREENING     Status: Normal   Collection Time   07/28/11  6:37 PM      Component Value Range Status Comment   MRSA by PCR NEGATIVE  NEGATIVE  Final     Studies/Results: Ct Head Wo Contrast  07/28/2011  *RADIOLOGY REPORT*  Clinical Data:  Loss of consciousness, fall 2 days ago, forehead injury, left frontal orbital bruising.  CT HEAD WITHOUT CONTRAST CT CERVICAL SPINE WITHOUT CONTRAST  Technique:  Multidetector CT imaging of the head and cervical spine was performed following the standard protocol without  intravenous contrast.  Multiplanar CT image reconstructions of the cervical spine were also generated.  Comparison:   None  CT HEAD  Findings: Small to moderate left anterior frontal scalp hematoma. Slight soft tissue swelling.  No acute intracranial hemorrhage, infarction, mass lesion, midline shift, herniation, hydrocephalus, or extra-axial fluid collection.  Cisterns patent.  No cerebellar abnormality.  Orbits symmetric.  Mastoids and visualized sinuses clear.  IMPRESSION: Left frontal scalp hematoma.  No acute intracranial finding or underlying skull fracture.  CT CERVICAL SPINE  Findings: Normal cervical spine alignment.  Preserved vertebral body heights and disc spaces.  No significant degenerative disc disease and spondylosis.  Normal prevertebral soft tissues. Negative for fracture, compression deformity, or focal kyphosis. Very minor facet arthropathy on the right at C2-3.  IMPRESSION: No acute fracture or injury of the cervical spine by CT.  Original Report Authenticated By: Judie Petit. Ruel Favors, M.D.   Ct Cervical Spine Wo Contrast  07/28/2011  *RADIOLOGY REPORT*  Clinical Data:  Loss of consciousness, fall 2 days ago, forehead injury, left frontal orbital bruising.  CT HEAD WITHOUT CONTRAST CT CERVICAL SPINE WITHOUT CONTRAST  Technique:  Multidetector CT imaging of the head and cervical spine was performed following the standard protocol without intravenous contrast.  Multiplanar CT image reconstructions of the cervical spine were also generated.  Comparison:   None  CT HEAD  Findings: Small to moderate left anterior frontal scalp hematoma. Slight soft tissue swelling.  No acute intracranial hemorrhage, infarction, mass lesion, midline shift, herniation, hydrocephalus, or extra-axial fluid collection.  Cisterns patent.  No cerebellar abnormality.  Orbits symmetric.  Mastoids and visualized sinuses clear.  IMPRESSION: Left frontal scalp hematoma.  No acute intracranial finding or underlying skull fracture.   CT CERVICAL SPINE  Findings: Normal cervical spine alignment.  Preserved vertebral body heights and disc spaces.  No significant degenerative disc disease and spondylosis.  Normal prevertebral soft tissues. Negative for fracture, compression deformity, or focal kyphosis. Very minor facet arthropathy on the right at C2-3.  IMPRESSION: No acute fracture or injury of the cervical spine by CT.  Original Report Authenticated By: Judie Petit. Ruel Favors, M.D.   Dg Chest Port 1 View  07/28/2011  *RADIOLOGY REPORT*  Clinical Data: Syncopal episode 3 days ago.  PORTABLE CHEST - 1 VIEW  Comparison: 02/03/2011.  Findings: Minimal peribronchial thickening.  No segmental consolidation or pulmonary edema.  Pleural reflection associated undersurface left second rib.  No gross pneumothorax.  Heart size within normal limits.  IMPRESSION: Peribronchial thickening stable.  Original Report Authenticated By: Fuller Canada, M.D.    Medications: Scheduled Meds:   . sodium chloride   Intravenous Once  . sodium chloride   Intravenous STAT  . aspirin EC  325 mg Oral Daily  . cefTRIAXone (ROCEPHIN)  IV  1 g Intravenous Q24H  . enoxaparin  40 mg Subcutaneous Q24H  .  morphine injection  4 mg Intravenous Once  . ondansetron  4 mg Intravenous Once  . ondansetron  4 mg Intravenous Once  .  potassium chloride  40 mEq Oral Once  . sodium chloride  1,000 mL Intravenous Once  . sodium chloride  1,000 mL Intravenous Once  . sodium chloride  1,000 mL Intravenous Once   Continuous Infusions:   . sodium chloride 100 mL/hr at 07/29/11 0800   PRN Meds:.acetaminophen, acetaminophen, albuterol, metoprolol, morphine, ondansetron (ZOFRAN) IV, ondansetron  Assessment/Plan:  Principal Problem:  *Syncope and collapse Active Problems:  Tachycardia  Hyponatremia  Dehydration  Leukocytosis  UTI (lower urinary tract infection)  1. Syncope, appears to be secondary to dehydration, improving with IVF, cardiac enzymes are negative,  appreciate cardiology input.  Will check echo. Follow up thyroid studies. Continue to monitor on telemetry  2. Leukocytosis.  Patient did have a fever last night.  She has been started on rocephin for possible UTI.  Follow up urine culture.  Also will recheck chest xray to ensure she is not developing a pneumonia.  3. Hyponatremia, likely due to hypovolemia.  Improving with saline.  4. Mild confusion, possibly due to dehydration, improved  5. Tachycardia, improved, likely due to volume depletion, improved this morning.  6. Abd pain, mild, will check acute abdominal series.   Transfer to telemetry today   LOS: 1 day   Ethelyn Cerniglia [facility] Pager: [phone] 07/29/2011, 10:16 AM

## 2011-07-30 LAB — BASIC METABOLIC PANEL
GFR calc Af Amer: >90 mL/min (ref >90)
GFR calc non Af Amer: >90 mL/min (ref >90)
Potassium: 3.2 mEq/L — ABNORMAL LOW (ref 3.5–5.1)
Sodium: 136 mEq/L (ref 135–145)

## 2011-07-30 LAB — CBC
Hemoglobin: 10.7 g/dL — ABNORMAL LOW (ref 12.0–15.0)
RBC: 3.1 MIL/uL — ABNORMAL LOW (ref 3.87–5.11)
WBC: 5.3 10*3/uL (ref 4.0–10.5)

## 2011-07-30 MED ORDER — POTASSIUM CHLORIDE CRYS ER 20 MEQ PO TBCR
40.0000 meq | EXTENDED_RELEASE_TABLET | Freq: Once | ORAL | Status: AC
  Administered 2011-07-30: 40 meq via ORAL
  Filled 2011-07-30: qty 2

## 2011-07-30 MED ORDER — METOPROLOL TARTRATE 25 MG PO TABS
12.5000 mg | ORAL_TABLET | Freq: Two times a day (BID) | ORAL | Status: DC
  Administered 2011-07-30: 12.5 mg via ORAL
  Filled 2011-07-30: qty 1

## 2011-07-30 MED ORDER — METOPROLOL TARTRATE 12.5 MG HALF TABLET: 12.5000 mg | ORAL_TABLET | Freq: Two times a day (BID) | ORAL | Status: DC

## 2011-07-30 MED ORDER — LEVOFLOXACIN 750 MG PO TABS: 750.0000 mg | ORAL_TABLET | Freq: Every day | ORAL | Status: AC

## 2011-07-30 MED ORDER — GUAIFENESIN ER 600 MG PO TB12: 1200.0000 mg | ORAL_TABLET | Freq: Two times a day (BID) | ORAL | Status: AC

## 2011-07-30 NOTE — Progress Notes (Signed)
Discharge instructions given on medications,and follow up visits,patient verbalized understanding.Prescriptions sent with patient.No C/O pain or discomfort noted.Accompanied by staff to an awaiting vehicle.

## 2011-07-30 NOTE — Discharge Summary (Signed)
Physician Discharge Summary  Patient ID: Bethany Espinoza MRN: [id number] DOB/AGE: 1966-03-18 46 y.o.  Admit date: 07/28/2011 Discharge date: 07/30/2011  Primary Care Physician:  Colette Ribas, MD, MD   Discharge Diagnoses:    Principal Problem:  *Syncope and collapse, secondary to dehydration and pneumonia Active Problems:  Pneumonia  Tachycardia  Hyponatremia  Dehydration  Leukocytosis Hypertension    Medication List  As of 07/30/2011 12:08 PM   TAKE these medications         albuterol 108 (90 BASE) MCG/ACT inhaler   Commonly known as: PROVENTIL HFA;VENTOLIN HFA   Inhale 2 puffs into the lungs every 6 (six) hours as needed. Shortness of breath      GOODYS BODY PAIN PO   Take 1 packet by mouth daily as needed. Pain      guaiFENesin 600 MG 12 hr tablet   Commonly known as: MUCINEX   Take 2 tablets (1,200 mg total) by mouth 2 (two) times daily.      levofloxacin 750 MG tablet   Commonly known as: LEVAQUIN   Take 1 tablet (750 mg total) by mouth daily.      metoprolol tartrate 12.5 mg Tabs   Commonly known as: LOPRESSOR   Take 0.5 tablets (12.5 mg total) by mouth 2 (two) times daily.           Discharge Exam: Blood pressure 147/97, pulse 89, temperature 98.7 F (37.1 C), temperature source Oral, resp. rate 20, height 5\' 6"  (1.676 m), weight 61.4 kg (135 lb 5.8 oz), SpO2 96.00%. Unchanged from 07/29/11  Disposition and Follow-up:  Follow up with Dr. Phillips Odor in 1-2weeks   Consults:  Cardiology, [place]   Significant Diagnostic Studies:  Ct Head Wo Contrast  07/28/2011  *RADIOLOGY REPORT*  Clinical Data:  Loss of consciousness, fall 2 days ago, forehead injury, left frontal orbital bruising.  CT HEAD WITHOUT CONTRAST CT CERVICAL SPINE WITHOUT CONTRAST  Technique:  Multidetector CT imaging of the head and cervical spine was performed following the standard protocol without intravenous contrast.  Multiplanar CT image reconstructions of the cervical spine were also  generated.  Comparison:   None  CT HEAD  Findings: Small to moderate left anterior frontal scalp hematoma. Slight soft tissue swelling.  No acute intracranial hemorrhage, infarction, mass lesion, midline shift, herniation, hydrocephalus, or extra-axial fluid collection.  Cisterns patent.  No cerebellar abnormality.  Orbits symmetric.  Mastoids and visualized sinuses clear.  IMPRESSION: Left frontal scalp hematoma.  No acute intracranial finding or underlying skull fracture.  CT CERVICAL SPINE  Findings: Normal cervical spine alignment.  Preserved vertebral body heights and disc spaces.  No significant degenerative disc disease and spondylosis.  Normal prevertebral soft tissues. Negative for fracture, compression deformity, or focal kyphosis. Very minor facet arthropathy on the right at C2-3.  IMPRESSION: No acute fracture or injury of the cervical spine by CT.  Original Report Authenticated By: Judie Petit. Ruel Favors, M.D.   Ct Cervical Spine Wo Contrast  07/28/2011  *RADIOLOGY REPORT*  Clinical Data:  Loss of consciousness, fall 2 days ago, forehead injury, left frontal orbital bruising.  CT HEAD WITHOUT CONTRAST CT CERVICAL SPINE WITHOUT CONTRAST  Technique:  Multidetector CT imaging of the head and cervical spine was performed following the standard protocol without intravenous contrast.  Multiplanar CT image reconstructions of the cervical spine were also generated.  Comparison:   None  CT HEAD  Findings: Small to moderate left anterior frontal scalp hematoma. Slight soft tissue swelling.  No acute intracranial  hemorrhage, infarction, mass lesion, midline shift, herniation, hydrocephalus, or extra-axial fluid collection.  Cisterns patent.  No cerebellar abnormality.  Orbits symmetric.  Mastoids and visualized sinuses clear.  IMPRESSION: Left frontal scalp hematoma.  No acute intracranial finding or underlying skull fracture.  CT CERVICAL SPINE  Findings: Normal cervical spine alignment.  Preserved vertebral body  heights and disc spaces.  No significant degenerative disc disease and spondylosis.  Normal prevertebral soft tissues. Negative for fracture, compression deformity, or focal kyphosis. Very minor facet arthropathy on the right at C2-3.  IMPRESSION: No acute fracture or injury of the cervical spine by CT.  Original Report Authenticated By: Judie Petit. Ruel Favors, M.D.   Dg Chest Port 1 View  07/28/2011  *RADIOLOGY REPORT*  Clinical Data: Syncopal episode 3 days ago.  PORTABLE CHEST - 1 VIEW  Comparison: 02/03/2011.  Findings: Minimal peribronchial thickening.  No segmental consolidation or pulmonary edema.  Pleural reflection associated undersurface left second rib.  No gross pneumothorax.  Heart size within normal limits.  IMPRESSION: Peribronchial thickening stable.  Original Report Authenticated By: Fuller Canada, M.D.   Dg Abd Acute W/chest  07/29/2011  *RADIOLOGY REPORT*  Clinical Data: Abdominal pain, cough.  ACUTE ABDOMEN SERIES (ABDOMEN 2 VIEW & CHEST 1 VIEW)  Comparison: Chest x-ray 07/28/2011  Findings: New right perihilar airspace disease compatible with pneumonia.  No confluent opacity on the left.  Heart is normal size.  No effusions.  There is normal bowel gas pattern.  No free air.  No organomegaly or suspicious calcification.  No acute bony abnormality.  IMPRESSION: Right perihilar infiltrate, new since prior study compatible with pneumonia.  No evidence of bowel obstruction or free air.  Original Report Authenticated By: Cyndie Chime, M.D.    Brief H and P: For complete details please refer to admission H and P, but in brief This is a 46 year old black female with past medical history of hypertension who was brought to the emergency room after a syncopal episode. Syncopal episodes occurred approximately 2 days ago. Patient reports that on Sunday she was in her bathroom started to feel increasingly weak and passed out. She then woke up went to the kitchen to try to drink some soda. The next thing  she new she was lying on the floor and was soda all over her. She does not actually remember falling and hitting the ground. She did suffer a hematoma to her frontal scalp. She does also have ecchymosis on her left orbit. Patient reports that these symptoms have occurred in the past as well. She has not sought out any medical treatment since she fell these resolve on their own. She reports of having a chest pain that is constant across her entire chest. She's had this pain for approximately 2 weeks now. He was reports that his resuming coughing. She also has some nausea and had some vomiting yesterday as well. She denies any diarrhea. She's not sure whether she's had a fever but is experiencing chills. Evaluation in the ER, CT scan of the brain did not reveal any intracranial pathology, there was a left frontal scalp hematoma. She was noted to be present and tachycardic in the 140s. Her heart rate has been fluctuating between 80s to 90s to 140s 150s. She is in a sinus rhythm. Was also noted that her urine was quite turbid. She's received a total of 2 L of normal saline and she's currently on her third liter at this time. She's not had any hypotension, she's not been  febrile. Remainder of the workup was relatively unremarkable. Patient has been referred for admission. Her family members note that she may be having some mild confusion     Hospital Course:  Principal Problem:  *Syncope and collapse Active Problems:  Tachycardia  Hyponatremia  Dehydration  Leukocytosis  Pneumonia  This lady was admitted to the hospital with syncope and collapse. On evaluation in the emergency room she was noted to be tachycardic and clinically dehydrated. She had received a fair amount of fluid in the emergency room and when her symptoms persisted she was admitted to the hospital for further evaluation. Patient had a CT scan of the head done which did not show any acute intracranial findings, 2-D echocardiogram was also  found to be unremarkable. Patient was noted to have a left frontal scalp hematoma and some bruising around her left orbit, but other than that, there was no other injury noted. It was felt that her syncope was likely secondary to volume depletion. She was adequately rehydrated with IV fluids and has not had any recurrence of her symptoms. She was advised to keep herself hydrated when she is discharged. Patient was also noted to be febrile here in the hospital. Initial chest x-ray on admission did not show any acute infiltrates. Repeat chest x-ray post hydration revealed a developing right-sided pneumonia. Patient was started on antibiotics and has been afebrile since then. She does have a productive cough. She does not feel short of breath. Her tachycardia has since improved with IV fluids. Her hyponatremia which is likely secondary to volume depletion has also since improved with saline. Leukocytosis is also resolved. Patient will be discharged home today on oral antibiotics and followup with her primary care physician. Her blood pressure was noted to be mildly elevated so she'll be started on low-dose metoprolol. This can be further evaluated by her primary care physician.   Time spent on Discharge:  Signed: Brevon Dewald [facility] Pager: [phone] 07/30/2011, 12:08 PM

## 2011-07-30 NOTE — Discharge Instructions (Signed)

## 2011-07-30 NOTE — Progress Notes (Signed)
CARE MANAGEMENT NOTE 07/30/2011  Patient:  Bethany Espinoza, Bethany Espinoza   Account Number:  [id number]  Date Initiated:  07/30/2011  Documentation initiated by:  Rosemary Holms  Subjective/Objective Assessment:   Pt admitted after falling/ ? concussion. PTA lived at home and independent.     Action/Plan:   Plan to dc home. Will need assistance with medications at DC   Anticipated DC Date:  07/30/2011   Anticipated DC Plan:  HOME/SELF CARE  In-house referral  Financial Counselor      DC Planning Services  CM consult  Medication Assistance      Choice offered to / List presented to:             Status of service:  Completed, signed off Medicare Important Message given?   (If response is "NO", the following Medicare IM given date fields will be blank) Date Medicare IM given:   Date Additional Medicare IM given:    Discharge Disposition:  HOME/SELF CARE  Per UR Regulation:    Comments:  07/30/11 1200 Bethany Sova RN BSN CM Indigent Medication Program used for pt to have DC medications. [employer] given and explained to pt.

## 2012-10-27 ENCOUNTER — Encounter: Payer: Self-pay | Admitting: *Deleted

## 2012-10-28 ENCOUNTER — Other Ambulatory Visit: Payer: Self-pay | Admitting: Adult Health

## 2013-01-02 ENCOUNTER — Ambulatory Visit (INDEPENDENT_AMBULATORY_CARE_PROVIDER_SITE_OTHER): Payer: BC Managed Care – PPO | Admitting: Adult Health

## 2013-01-02 ENCOUNTER — Encounter: Payer: Self-pay | Admitting: Adult Health

## 2013-01-02 VITALS — BP 138/82 | HR 78 | Ht 66.0 in | Wt 144.0 lb

## 2013-01-02 DIAGNOSIS — Z1212 Encounter for screening for malignant neoplasm of rectum: Secondary | ICD-10-CM

## 2013-01-02 DIAGNOSIS — T7589XA Other specified effects of external causes, initial encounter: Secondary | ICD-10-CM

## 2013-01-02 DIAGNOSIS — F319 Bipolar disorder, unspecified: Secondary | ICD-10-CM

## 2013-01-02 DIAGNOSIS — Z01419 Encounter for gynecological examination (general) (routine) without abnormal findings: Secondary | ICD-10-CM

## 2013-01-02 DIAGNOSIS — I10 Essential (primary) hypertension: Secondary | ICD-10-CM

## 2013-01-02 LAB — HEMOCCULT GUIAC POC 1CARD (OFFICE): Fecal Occult Blood, POC: NEGATIVE

## 2013-01-02 NOTE — Progress Notes (Signed)
Patient ID: Bethany Espinoza, female   DOB: 1966-05-13, 47 y.o.   MRN: [id number] History of Present Illness: Bethany Espinoza is a 47 year old black female in for a physical and she requests a throat swap for GC/CHL as she had oral sex and is worried she was exposed.   Current Medications, Allergies, Past Medical History, Past Surgical History, Family History and Social History were reviewed in [employer] record.     Review of Systems: Patient denies any headaches, blurred vision, shortness of breath, chest pain, abdominal pain, problems with bowel movements, urination, or intercourse. She is not having vaginal sex.No joint pain or changes in mood, she sees Dr Westley Chandler in [place] for her Bipolar.    Physical Exam:BP 138/82  Pulse 78  Ht 5\' 6"  (1.676 m)  Wt 144 lb (65.318 kg)  BMI 23.25 kg/m2 General:  Well developed, well nourished, no acute distress Skin:  Warm and dry Neck:  Midline trachea, normal thyroid, throat with out lesions and culture obtained. Lungs; Clear to auscultation bilaterally Breast:  No dominant palpable mass, retraction, or nipple discharge Cardiovascular: Regular rate and rhythm Abdomen:  Soft, non tender, no hepatosplenomegaly Pelvic:  External genitalia is normal in appearance.  The vagina is normal in appearance. The cervix and uterus are absent. No adnexal masses or tenderness noted. Rectal: Good sphincter tone, no polyps, or hemorrhoids felt.  Hemoccult negative. Extremities:  No swelling or varicosities noted Psych:  Alert and cooperative.   Impression:  Yearly gyn exam-no pap History of hypertension and bipolar Screening for oral GC/CHL   Plan: Physical in 1 year Mammogram yearly Colonoscopy at 51 Follow up throat culture 48-72 hours

## 2013-01-02 NOTE — Patient Instructions (Addendum)
Follow up in 1 year for physical  Get mammogram  Labs with PCP

## 2013-01-05 ENCOUNTER — Telehealth: Payer: Self-pay | Admitting: Adult Health

## 2013-01-05 NOTE — Telephone Encounter (Signed)
Pt aware culture negative

## 2013-03-08 IMAGING — CR DG ABDOMEN ACUTE W/ 1V CHEST
3 series · 3 of 3 positions shown · non-contrast
Comparison: Chest x-ray 07/28/2011

CLINICAL DATA: Abdominal pain, cough.

ACUTE ABDOMEN SERIES (ABDOMEN 2 VIEW & CHEST 1 VIEW)

[view not recorded (1 of 3)]
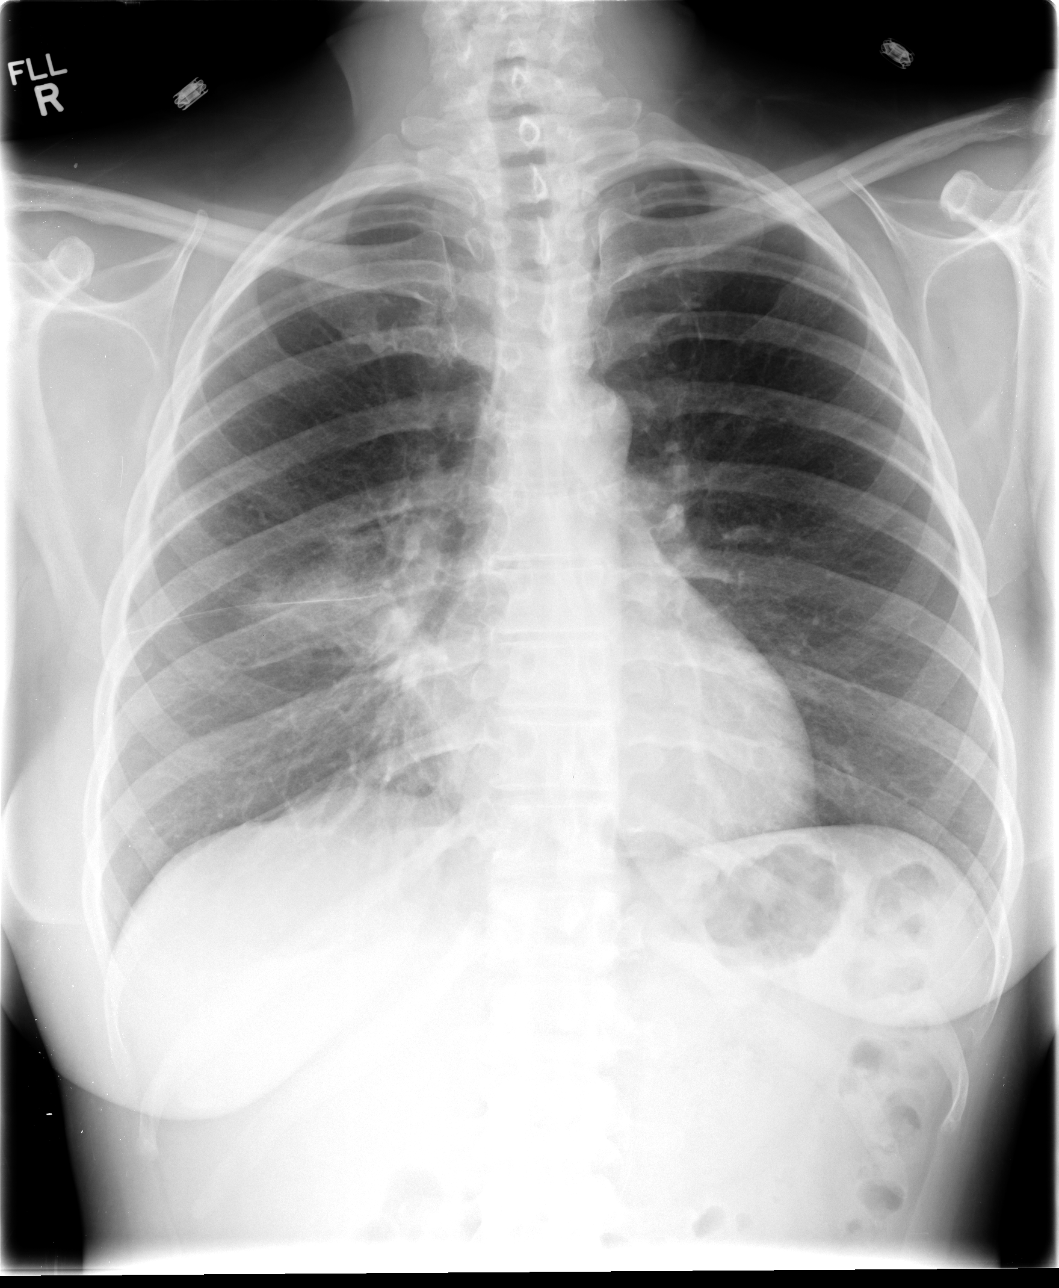

[view not recorded (2 of 3)]
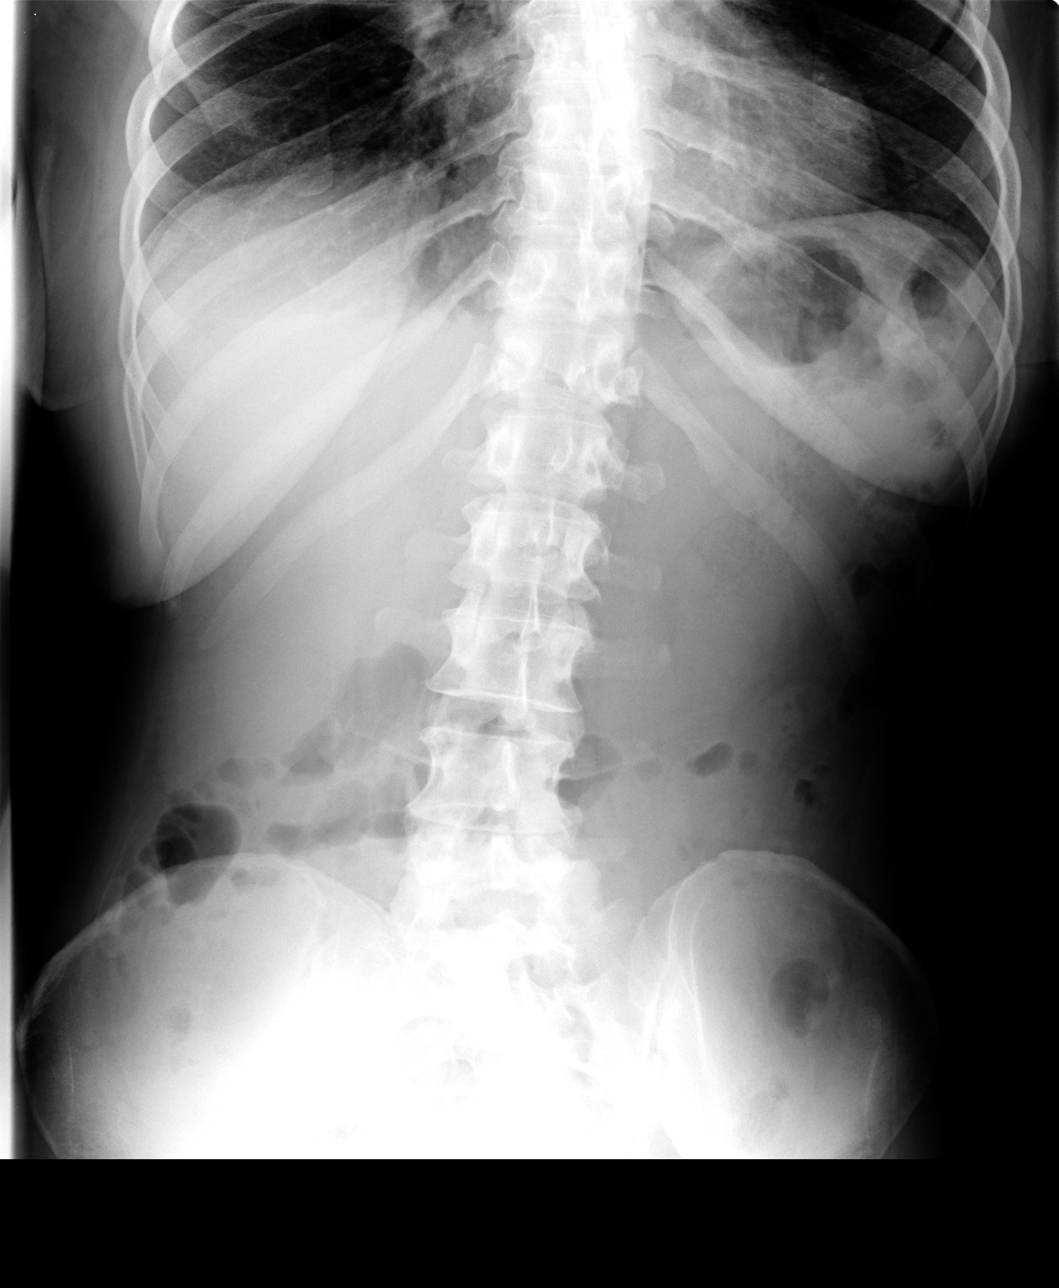

[view not recorded (3 of 3)]
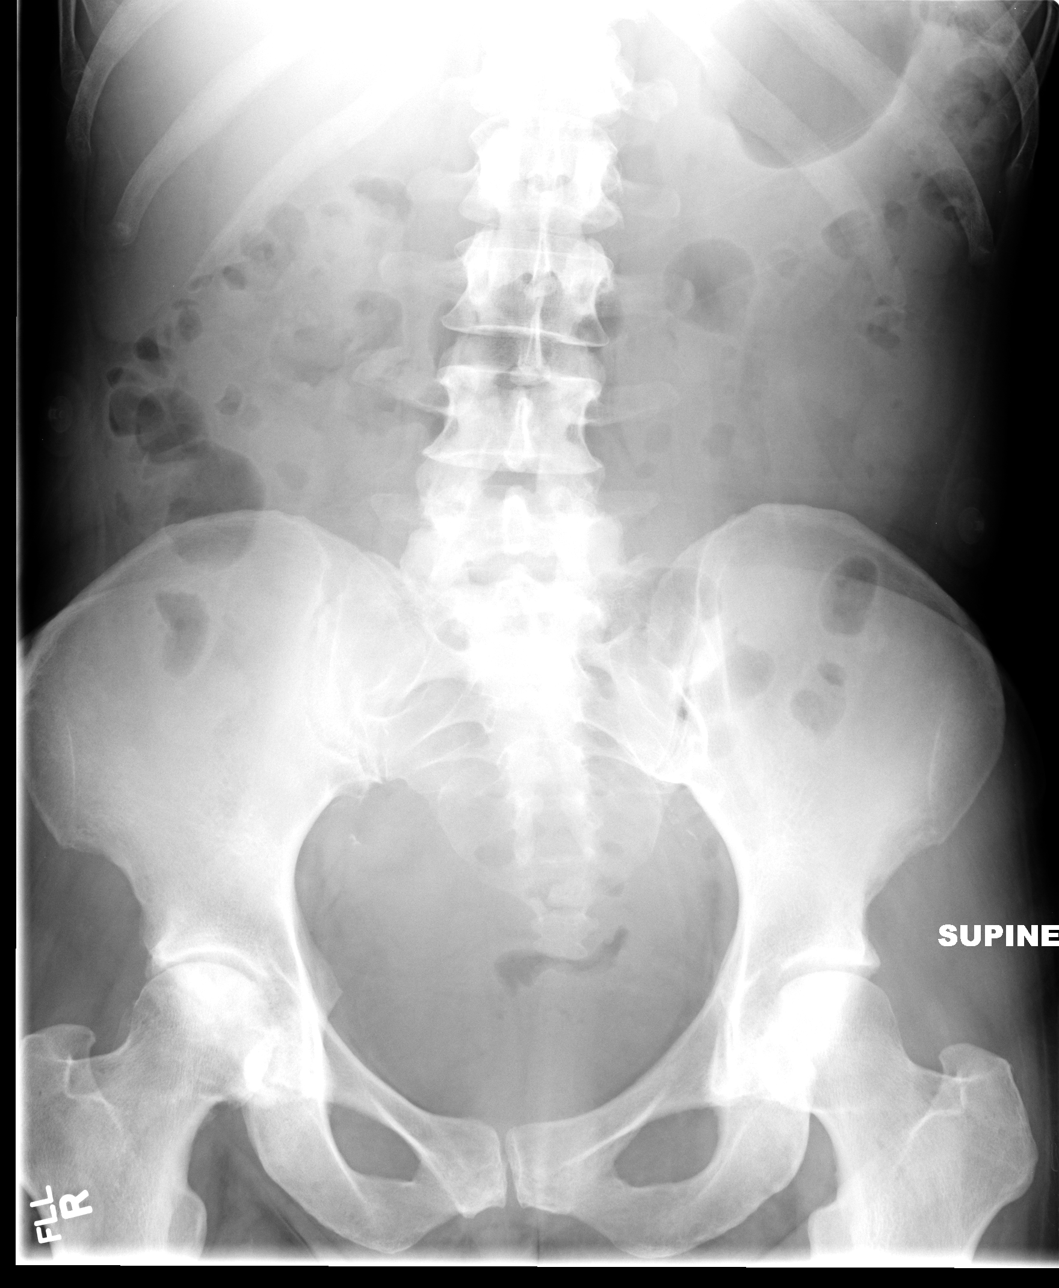

[3 of 3 positions shown; findings below may reference images not displayed]

FINDINGS: New right perihilar airspace disease compatible with
pneumonia.  No confluent opacity on the left.  Heart is normal
size.  No effusions.

There is normal bowel gas pattern.  No free air.  No organomegaly
or suspicious calcification.  No acute bony abnormality.
IMPRESSION: Right perihilar infiltrate, new since prior study compatible with
pneumonia.

No evidence of bowel obstruction or free air.

## 2014-03-26 ENCOUNTER — Encounter: Payer: Self-pay | Admitting: Adult Health

## 2014-04-30 ENCOUNTER — Encounter: Payer: Self-pay | Admitting: Internal Medicine

## 2014-05-11 ENCOUNTER — Other Ambulatory Visit (HOSPITAL_COMMUNITY): Payer: Self-pay | Admitting: Family Medicine

## 2014-05-11 DIAGNOSIS — F17200 Nicotine dependence, unspecified, uncomplicated: Secondary | ICD-10-CM

## 2014-05-11 DIAGNOSIS — R062 Wheezing: Secondary | ICD-10-CM

## 2014-05-11 DIAGNOSIS — Z0000 Encounter for general adult medical examination without abnormal findings: Secondary | ICD-10-CM

## 2014-05-11 DIAGNOSIS — R634 Abnormal weight loss: Secondary | ICD-10-CM

## 2014-05-11 DIAGNOSIS — I10 Essential (primary) hypertension: Secondary | ICD-10-CM

## 2014-05-14 ENCOUNTER — Ambulatory Visit (HOSPITAL_COMMUNITY)
Admission: RE | Admit: 2014-05-14 | Discharge: 2014-05-14 | Disposition: A | Discharge status: Home / Self Care | Payer: BC Managed Care – PPO | Source: Ambulatory Visit | Attending: Family Medicine | Admitting: Family Medicine

## 2014-05-14 DIAGNOSIS — F17200 Nicotine dependence, unspecified, uncomplicated: Secondary | ICD-10-CM

## 2014-05-14 DIAGNOSIS — Z720 Tobacco use: Secondary | ICD-10-CM | POA: Insufficient documentation

## 2014-05-14 DIAGNOSIS — J439 Emphysema, unspecified: Secondary | ICD-10-CM | POA: Diagnosis not present

## 2014-05-14 DIAGNOSIS — R062 Wheezing: Secondary | ICD-10-CM | POA: Diagnosis present

## 2014-05-14 DIAGNOSIS — R634 Abnormal weight loss: Secondary | ICD-10-CM | POA: Insufficient documentation

## 2014-05-14 DIAGNOSIS — Z0000 Encounter for general adult medical examination without abnormal findings: Secondary | ICD-10-CM

## 2014-05-14 DIAGNOSIS — I10 Essential (primary) hypertension: Secondary | ICD-10-CM | POA: Insufficient documentation

## 2014-05-14 MED ORDER — IOHEXOL 300 MG/ML  SOLN
80.0000 mL | Freq: Once | INTRAMUSCULAR | Status: AC | PRN
  Administered 2014-05-14: 80 mL via INTRAVENOUS

## 2014-06-12 ENCOUNTER — Ambulatory Visit: Payer: Self-pay | Admitting: Gastroenterology

## 2014-06-28 ENCOUNTER — Other Ambulatory Visit: Payer: Self-pay

## 2014-06-28 ENCOUNTER — Encounter (INDEPENDENT_AMBULATORY_CARE_PROVIDER_SITE_OTHER): Payer: Self-pay

## 2014-06-28 ENCOUNTER — Ambulatory Visit (INDEPENDENT_AMBULATORY_CARE_PROVIDER_SITE_OTHER): Payer: BLUE CROSS/BLUE SHIELD | Admitting: Gastroenterology

## 2014-06-28 ENCOUNTER — Encounter: Payer: Self-pay | Admitting: Gastroenterology

## 2014-06-28 VITALS — BP 162/97 | HR 84 | Temp 97.7°F | Ht 66.0 in | Wt 123.2 lb

## 2014-06-28 DIAGNOSIS — R634 Abnormal weight loss: Secondary | ICD-10-CM

## 2014-06-28 MED ORDER — PEG 3350-KCL-NA BICARB-NACL 420 G PO SOLR: 4000.0000 mL | ORAL | Status: DC

## 2014-06-28 NOTE — Assessment & Plan Note (Addendum)
49 year old African-American female with documented unintentional weight loss of over 30 lbs through PCP office, presenting for further evaluation. No concerning GI symptoms such as change in bowel habits, rectal bleeding, loss of appetite, N/V, dysphagia, early satiety, abdominal pain. Somewhat poor historian. Last colonoscopy in the remote past in [place]. Needs at a minimum lower GI evaluation via colonoscopy. Will retrieve outside labs to assess for any evidence of anemia; if evidence of IDA, would need EGD as well. As of note, patient reports occasional syncopal episodes over the past 2 years but is REFUSING any further work-up of this. No concerning findings on exam. Discussed with patient the importance of further investigation, but she continues to decline this.    Proceed with TCS +/- EGD (if indicated) with Dr. Gala Romney in near future: the risks, benefits, and alternatives have been discussed with the patient in detail. The patient states understanding and desires to proceed. Phenergan 25 mg IV on call due to polypharmacy

## 2014-06-28 NOTE — Progress Notes (Signed)
Referring Provider: Sharilyn Sites, MD Primary Care Physician:  Purvis Kilts, MD Primary Gastroenterologist:  Dr. Gala Romney   Chief Complaint  Patient presents with  . Weight Loss    HPI:   Bethany Espinoza is a 49 y.o. female presenting today at the request of Sharilyn Sites, MD secondary to weight loss. Per outside notes, records documented over 30 lbs weight loss in the past year since she had been seen by her PCP. Upon review of epic, weight documented at 144 in 2014. Today 123. She tells me that her baseline weight in the past has been around 160, then states she has been losing "since 2008". Somewhat poor historian.   No abdominal pain. No N/V. No dysphagia. No reflux symptoms. Just has "lots of gas". Has to "force it out". No hematochezia. No melena. Appetite good. Denies early satiety. Unintentional weight loss.   She does endorse syncopal episodes for the past 2 years, occurring every few months. She states she told the nurse at the PCP office, but she has not undergone work-up for this. She denies any other accompanying symptoms with these episodes. She is REFUSING further work-up of this.   Past Medical History  Diagnosis Date  . Hypertension   . Asthma   . Mental disorder     bipolar, sees Dr Wylene Simmer    Past Surgical History  Procedure Laterality Date  . Abdominal hysterectomy    . Colonoscopy      remote past in [place].     Current Outpatient Prescriptions  Medication Sig Dispense Refill  . albuterol (PROVENTIL HFA;VENTOLIN HFA) 108 (90 BASE) MCG/ACT inhaler Inhale 2 puffs into the lungs every 6 (six) hours as needed. Shortness of breath    . Aspirin-Acetaminophen (GOODYS BODY PAIN PO) Take 1 packet by mouth daily as needed. Pain    . fluticasone (FLOVENT DISKUS) 50 MCG/BLIST diskus inhaler Inhale 1 puff into the lungs 2 (two) times daily.    Marland Kitchen FLUTICASONE PROPIONATE  HFA IN Inhale into the lungs.    . lamoTRIgine (LAMICTAL) 150 MG tablet Take 300 mg by  mouth daily.    Marland Kitchen VITAMIN C, CALCIUM ASCORBATE, PO Take 1 capsule by mouth daily.    . ziprasidone (GEODON) 60 MG capsule Take 60 mg by mouth daily.    . metoprolol tartrate (LOPRESSOR) 12.5 mg TABS Take 0.5 tablets (12.5 mg total) by mouth 2 (two) times daily. 30 tablet 0  . polyethylene glycol-electrolytes (TRILYTE) 420 G solution Take 4,000 mLs by mouth as directed. 4000 mL 0   No current facility-administered medications for this visit.    Allergies as of 06/28/2014 - Review Complete 06/28/2014  Allergen Reaction Noted  . Codeine Hives and Rash 07/28/2011    Family History  Problem Relation Age of Onset  . Cancer Father     lung  . Cancer Mother     lymphoma  . Stroke Mother   . Hypertension Mother   . Heart murmur Mother   . Cancer Maternal Grandfather     prostate  . Colon cancer Neg Hx     History   Social History  . Marital Status: Single    Spouse Name: N/A    Number of Children: N/A  . Years of Education: N/A   Occupational History  . [employer]    Social History Main Topics  . Smoking status: Current Every Day Smoker -- 0.50 packs/day for 29 years    Types: Cigarettes  .  Smokeless tobacco: Never Used  . Alcohol Use: Yes     Comment: social alcohol  . Drug Use: No  . Sexual Activity: Yes    Birth Control/ Protection: Surgical   Other Topics Concern  . Not on file   Social History Narrative    Review of Systems: As mentioned in HPI  Physical Exam: BP 162/97 mmHg  Pulse 84  Temp(Src) 97.7 F (36.5 C)  Ht 5\' 6"  (1.676 m)  Wt 123 lb 3.2 oz (55.883 kg)  BMI 19.89 kg/m2 General:   Alert and oriented. Well-developed, well-nourished, pleasant and cooperative. Head:  Normocephalic and atraumatic. Eyes:  Conjunctiva pink, sclera clear, no icterus.   Conjunctiva pink. Ears:  Normal auditory acuity. Nose:  No deformity, discharge,  or lesions. Mouth:  No deformity or lesions, mucosa pink and moist.  Neck:  Supple, without mass or  thyromegaly. Lungs:  Clear to auscultation bilaterally, without wheezing, rales, or rhonchi.  Heart:  S1, S2 present without murmurs noted. Carotid arteries auscultated without evidence of bruits Abdomen:  +BS, soft, non-tender and non-distended. Without mass or HSM. No rebound or guarding. No hernias noted. Rectal:  Deferred  Msk:  Symmetrical without gross deformities. Normal posture. Extremities:  Without  edema. Neurologic:  Alert and  oriented x4;  grossly normal neurologically. Skin:  Intact, warm and dry without significant lesions or rashes Psych:  Alert and cooperative. Normal mood and affect.

## 2014-06-28 NOTE — Patient Instructions (Signed)
We have scheduled you for a colonoscopy (and possible upper endoscopy if any abnormality with your blood work) with Dr. Gala Romney in the near future.  Further recommendations to follow!

## 2014-06-29 NOTE — Progress Notes (Signed)
cc'ed to pcp °

## 2014-07-03 NOTE — Progress Notes (Signed)
Received outside labs dated Jan 2016. Hgb 13.2, Hct 38. No anemia noted. Proceed with colonoscopy. No upper GI symptoms. Will leave possible endoscopy just in case she develops any upper GI symptoms or colonoscopy unrevealing.

## 2014-07-20 ENCOUNTER — Ambulatory Visit (HOSPITAL_COMMUNITY)
Admission: RE | Admit: 2014-07-20 | Discharge: 2014-07-20 | Disposition: A | Discharge status: Home / Self Care | Payer: BLUE CROSS/BLUE SHIELD | Source: Ambulatory Visit | Attending: Internal Medicine | Admitting: Internal Medicine

## 2014-07-20 ENCOUNTER — Encounter (HOSPITAL_COMMUNITY)
Admission: RE | Disposition: A | Discharge status: Home / Self Care | Payer: Self-pay | Source: Ambulatory Visit | Attending: Internal Medicine

## 2014-07-20 ENCOUNTER — Encounter (HOSPITAL_COMMUNITY): Payer: Self-pay | Admitting: *Deleted

## 2014-07-20 DIAGNOSIS — D124 Benign neoplasm of descending colon: Secondary | ICD-10-CM | POA: Insufficient documentation

## 2014-07-20 DIAGNOSIS — K449 Diaphragmatic hernia without obstruction or gangrene: Secondary | ICD-10-CM | POA: Insufficient documentation

## 2014-07-20 DIAGNOSIS — K5730 Diverticulosis of large intestine without perforation or abscess without bleeding: Secondary | ICD-10-CM | POA: Insufficient documentation

## 2014-07-20 DIAGNOSIS — K259 Gastric ulcer, unspecified as acute or chronic, without hemorrhage or perforation: Secondary | ICD-10-CM | POA: Insufficient documentation

## 2014-07-20 DIAGNOSIS — K279 Peptic ulcer, site unspecified, unspecified as acute or chronic, without hemorrhage or perforation: Secondary | ICD-10-CM | POA: Insufficient documentation

## 2014-07-20 DIAGNOSIS — K2100 Gastro-esophageal reflux disease with esophagitis, without bleeding: Secondary | ICD-10-CM | POA: Insufficient documentation

## 2014-07-20 DIAGNOSIS — Z1211 Encounter for screening for malignant neoplasm of colon: Secondary | ICD-10-CM | POA: Insufficient documentation

## 2014-07-20 DIAGNOSIS — K2950 Unspecified chronic gastritis without bleeding: Secondary | ICD-10-CM | POA: Insufficient documentation

## 2014-07-20 DIAGNOSIS — R634 Abnormal weight loss: Secondary | ICD-10-CM

## 2014-07-20 HISTORY — PX: COLONOSCOPY: SHX5424

## 2014-07-20 HISTORY — PX: ESOPHAGOGASTRODUODENOSCOPY: SHX5428

## 2014-07-20 SURGERY — COLONOSCOPY
Anesthesia: Moderate Sedation

## 2014-07-20 MED ORDER — MEPERIDINE HCL 100 MG/ML IJ SOLN
INTRAMUSCULAR | Status: AC
  Filled 2014-07-20: qty 2

## 2014-07-20 MED ORDER — SODIUM CHLORIDE 0.9 % IV SOLN
INTRAVENOUS | Status: DC
  Administered 2014-07-20: 08:00:00 via INTRAVENOUS

## 2014-07-20 MED ORDER — LIDOCAINE VISCOUS 2 % MT SOLN
OROMUCOSAL | Status: DC | PRN
  Administered 2014-07-20: 3 mL via OROMUCOSAL

## 2014-07-20 MED ORDER — SODIUM CHLORIDE 0.9 % IJ SOLN
INTRAMUSCULAR | Status: AC
  Filled 2014-07-20: qty 3

## 2014-07-20 MED ORDER — MIDAZOLAM HCL 5 MG/5ML IJ SOLN
INTRAMUSCULAR | Status: DC | PRN
  Administered 2014-07-20 (×2): 2 mg via INTRAVENOUS
  Administered 2014-07-20: 1 mg via INTRAVENOUS

## 2014-07-20 MED ORDER — ONDANSETRON HCL 4 MG/2ML IJ SOLN
INTRAMUSCULAR | Status: AC
  Filled 2014-07-20: qty 2

## 2014-07-20 MED ORDER — PROMETHAZINE HCL 25 MG/ML IJ SOLN
25.0000 mg | Freq: Once | INTRAMUSCULAR | Status: AC
  Administered 2014-07-20: 25 mg via INTRAVENOUS

## 2014-07-20 MED ORDER — MEPERIDINE HCL 100 MG/ML IJ SOLN
INTRAMUSCULAR | Status: DC | PRN
  Administered 2014-07-20 (×2): 50 mg via INTRAVENOUS

## 2014-07-20 MED ORDER — PROMETHAZINE HCL 25 MG/ML IJ SOLN
INTRAMUSCULAR | Status: AC
  Filled 2014-07-20: qty 1

## 2014-07-20 MED ORDER — MIDAZOLAM HCL 5 MG/5ML IJ SOLN
INTRAMUSCULAR | Status: AC
  Filled 2014-07-20: qty 10

## 2014-07-20 MED ORDER — STERILE WATER FOR IRRIGATION IR SOLN
Status: DC | PRN
  Administered 2014-07-20: 09:00:00

## 2014-07-20 MED ORDER — LIDOCAINE VISCOUS 2 % MT SOLN
OROMUCOSAL | Status: AC
  Filled 2014-07-20: qty 15

## 2014-07-20 MED ORDER — ONDANSETRON HCL 4 MG/2ML IJ SOLN
INTRAMUSCULAR | Status: DC | PRN
  Administered 2014-07-20: 4 mg via INTRAVENOUS

## 2014-07-20 NOTE — Discharge Instructions (Addendum)
Colonoscopy, Care After Refer to this sheet in the next few weeks. These instructions provide you with information on caring for yourself after your procedure. Your health care provider may also give you more specific instructions. Your treatment has been planned according to current medical practices, but problems sometimes occur. Call your health care provider if you have any problems or questions after your procedure. WHAT TO EXPECT AFTER THE PROCEDURE  After your procedure, it is typical to have the following:  A small amount of blood in your stool.  Moderate amounts of gas and mild abdominal cramping or bloating. HOME CARE INSTRUCTIONS  Do not drive, operate machinery, or sign important documents for 24 hours.  You may shower and resume your regular physical activities, but move at a slower pace for the first 24 hours.  Take frequent rest periods for the first 24 hours.  Walk around or put a warm pack on your abdomen to help reduce abdominal cramping and bloating.  Drink enough fluids to keep your urine clear or pale yellow.  You may resume your normal diet as instructed by your health care provider. Avoid heavy or fried foods that are hard to digest.  Avoid drinking alcohol for 24 hours or as instructed by your health care provider.  Only take over-the-counter or prescription medicines as directed by your health care provider.  If a tissue sample (biopsy) was taken during your procedure: 1. Do not take aspirin or blood thinners for 7 days, or as instructed by your health care provider. 2. Do not drink alcohol for 7 days, or as instructed by your health care provider. 3. Eat soft foods for the first 24 hours. SEEK MEDICAL CARE IF: You have persistent spotting of blood in your stool 2-3 days after the procedure. SEEK IMMEDIATE MEDICAL CARE IF:  You have more than a small spotting of blood in your stool.  You pass large blood clots in your stool.  Your abdomen is swollen  (distended).  You have nausea or vomiting.  You have a fever.  You have increasing abdominal pain that is not relieved with medicine. Document Released: 12/24/2003 Document Revised: 03/01/2013 Document Reviewed: 01/16/2013 [facility] Patient Information 2015 [place], [place]. This information is not intended to replace advice given to you by your health care provider. Make sure you discuss any questions you have with your health care provider.  Colonoscopy Discharge Instructions  Read the instructions outlined below and refer to this sheet in the next few weeks. These discharge instructions provide you with general information on caring for yourself after you leave the hospital. Your doctor may also give you specific instructions. While your treatment has been planned according to the most current medical practices available, unavoidable complications occasionally occur. If you have any problems or questions after discharge, call Dr. Gala Romney at [phone]. ACTIVITY  You may resume your regular activity, but move at a slower pace for the next 24 hours.   Take frequent rest periods for the next 24 hours.   Walking will help get rid of the air and reduce the bloated feeling in your belly (abdomen).   No driving for 24 hours (because of the medicine (anesthesia) used during the test).    Do not sign any important legal documents or operate any machinery for 24 hours (because of the anesthesia used during the test).  NUTRITION  Drink plenty of fluids.   You may resume your normal diet as instructed by your doctor.   Begin with a light meal  and progress to your normal diet. Heavy or fried foods are harder to digest and may make you feel sick to your stomach (nauseated).   Avoid alcoholic beverages for 24 hours or as instructed.  MEDICATIONS  You may resume your normal medications unless your doctor tells you otherwise.  WHAT YOU CAN EXPECT TODAY  Some feelings of bloating in the abdomen.     Passage of more gas than usual.   Spotting of blood in your stool or on the toilet paper.  IF YOU HAD POLYPS REMOVED DURING THE COLONOSCOPY:  No aspirin products for 7 days or as instructed.   No alcohol for 7 days or as instructed.   Eat a soft diet for the next 24 hours.  FINDING OUT THE RESULTS OF YOUR TEST Not all test results are available during your visit. If your test results are not back during the visit, make an appointment with your caregiver to find out the results. Do not assume everything is normal if you have not heard from your caregiver or the medical facility. It is important for you to follow up on all of your test results.  SEEK IMMEDIATE MEDICAL ATTENTION IF:  You have more than a spotting of blood in your stool.   Your belly is swollen (abdominal distention).   You are nauseated or vomiting.   You have a temperature over 101.   You have abdominal pain or discomfort that is severe or gets worse throughout the day.   EGD Discharge instructions Please read the instructions outlined below and refer to this sheet in the next few weeks. These discharge instructions provide you with general information on caring for yourself after you leave the hospital. Your doctor may also give you specific instructions. While your treatment has been planned according to the most current medical practices available, unavoidable complications occasionally occur. If you have any problems or questions after discharge, please call your doctor. ACTIVITY  You may resume your regular activity but move at a slower pace for the next 24 hours.   Take frequent rest periods for the next 24 hours.   Walking will help expel (get rid of) the air and reduce the bloated feeling in your abdomen.   No driving for 24 hours (because of the anesthesia (medicine) used during the test).   You may shower.   Do not sign any important legal documents or operate any machinery for 24 hours (because of  the anesthesia used during the test).  NUTRITION  Drink plenty of fluids.   You may resume your normal diet.   Begin with a light meal and progress to your normal diet.   Avoid alcoholic beverages for 24 hours or as instructed by your caregiver.  MEDICATIONS  You may resume your normal medications unless your caregiver tells you otherwise.  WHAT YOU CAN EXPECT TODAY  You may experience abdominal discomfort such as a feeling of fullness or gas pains.  FOLLOW-UP  Your doctor will discuss the results of your test with you.  SEEK IMMEDIATE MEDICAL ATTENTION IF ANY OF THE FOLLOWING OCCUR:  Excessive nausea (feeling sick to your stomach) and/or vomiting.   Severe abdominal pain and distention (swelling).   Trouble swallowing.   Temperature over 101 F (37.8 C).   Rectal bleeding or vomiting of blood.    GERD, peptic ulcer disease, diverticulosis and polyp information provided  Stop all forms of aspirin and other nonsteroidal medications (includes all forms of aspirin-containing headache powders)  Begin Protonix  40 mg twice daily  Further recommendations to follow pending review of pathology report  Office visit with [place] in 3 months.

## 2014-07-20 NOTE — H&P (View-Only) (Signed)
Received outside labs dated Jan 2016. Hgb 13.2, Hct 38. No anemia noted. Proceed with colonoscopy. No upper GI symptoms. Will leave possible endoscopy just in case she develops any upper GI symptoms or colonoscopy unrevealing.

## 2014-07-20 NOTE — Op Note (Signed)
[facility] [address] [place], [address]   COLONOSCOPY PROCEDURE REPORT  PATIENT: Bethany Espinoza, Bethany Espinoza  MR#: [id number] BIRTHDATE: 20-Aug-1965 , 80  yrs. old GENDER: female ENDOSCOPIST: R.  Garfield Cornea, MD FACP [facility] REFERRED IN:[facility] Hilma Favors, M.D. PROCEDURE DATE:  2014-08-13 PROCEDURE:   Ileo-Colonoscopy with biopsy INDICATIONS:   weight loss; screening colonoscopy. MEDICATIONS: Versed 8 mg IV and Demerol 100 mg IV in divided doses. Zofran 4 mg IV. ASA CLASS:       Class II  CONSENT: The risks, benefits, alternatives and imponderables including but not limited to bleeding, perforation as well as the possibility of a missed lesion have been reviewed.  The potential for biopsy, lesion removal, etc. have also been discussed. Questions have been answered.  All parties agreeable.  Please see the history and physical in the medical record for more information.  DESCRIPTION OF PROCEDURE:   After the risks benefits and alternatives of the procedure were thoroughly explained, informed consent was obtained.  The digital rectal exam revealed no abnormalities of the rectum.   The EC-[id number] ([id number])  endoscope was introduced through the anus and advanced to the terminal ileum which was intubated for a short distance. No adverse events experienced.   The quality of the prep was adequate  The instrument was then slowly withdrawn as the colon was fully examined.      COLON FINDINGS: Normal-appearing rectum.  Scattered left-sided diverticula; (1) diminutive polyp in the mid descending segment; otherwise, the remainder of the colonic mucosa appeared normal. The distal 5 cm of terminal ileal mucosa also appeared normal. Retroflexed views revealed no abnormalities. The above-mentioned problems: Biopsy/removed..  Withdrawal time=9 minutes 0 seconds.  The scope was withdrawn and the procedure completed. COMPLICATIONS: There were no immediate complications.  ENDOSCOPIC  IMPRESSION: Colonic diverticulosis. Single colonic polyp?"removed as described above  RECOMMENDATIONS: Follow-up on path pathology. See EGD report.  eSigned:  R. Garfield Cornea, MD Rosalita Chessman [facility] Aug 13, 2014 9:17 AM   cc:  CPT CODES: ICD CODES:  The ICD and CPT codes recommended by this software are interpretations from the data that the clinical staff has captured with the software.  The verification of the translation of this report to the ICD and CPT codes and modifiers is the sole responsibility of the health care institution and practicing physician where this report was generated.  [employer] [place]. will not be held responsible for the validity of the ICD and CPT codes included on this report.  AMA assumes no liability for data contained or not contained herein. CPT is a Designer, television/film set of the [employer].  PATIENT NAME:  [name], [name] MR#: [id number]

## 2014-07-20 NOTE — Op Note (Signed)
[facility] [address] [place], [address]   ENDOSCOPY PROCEDURE REPORT  PATIENT: Bethany Espinoza, Bethany Espinoza  MR#: [id number] BIRTHDATE: Aug 25, 1965 , 64  yrs. old GENDER: female ENDOSCOPIST: R.  Garfield Cornea, MD FACP FACG REFERRED BY:  Sharilyn Sites, M.D. PROCEDURE DATE:  August 01, 2014 PROCEDURE:  EGD w/ biopsy INDICATIONS:  anorexia; weight loss; negative colonoscopy; positive aspirin powder use. MEDICATIONS: Versed 5 mg IV and Demerol 100 mg IV in divided doses. Phenergan 25 mg IV.Xylocaine gel orally.  Zofran 4 mg IV. ASA CLASS:      Class II  CONSENT: The risks, benefits, limitations, alternatives and imponderables have been discussed.  The potential for biopsy, esophogeal dilation, etc. have also been reviewed.  Questions have been answered.  All parties agreeable.  Please see the history and physical in the medical record for more information.  DESCRIPTION OF PROCEDURE: After the risks benefits and alternatives of the procedure were thoroughly explained, informed consent was obtained.  The [id number] ([id number]) endoscope was introduced through the mouth and advanced to the second portion of the duodenum , limited by Without limitations. The instrument was slowly withdrawn as the mucosa was fully examined.    Distal esophageal erosions with small amount of adherent clot; no Barrett's esophagus.  Tubular esophagus wildly patent.  Stomach empty.  Small hiatal hernia.  Antral erosions and antral/pyloric channel deformity with (3) 6-9 mm stellate shaped ulcers in the pyloric channel - all with clean bases.  They appeared to be benign lesions.  The first and second portion of the duodenum appeared normal.  Retroflexed views revealed a hiatal hernia. Gastric ulcers were biopsied. The scope was then withdrawn from the patient and the procedure completed.  COMPLICATIONS: There were no immediate complications.  ENDOSCOPIC IMPRESSION: Erosive reflux esophagitis. Hiatal  hernia. Multiple gastric ulcers and erosions?"status post biopsy  RECOMMENDATIONS: Stop all forms of aspirin and other nonsteroidal agents.   Begin Protonix 40 mg twice daily. Follow-up on pathology. Office visit 3 months.  REPEAT EXAM:  eSigned:  R. Garfield Cornea, MD Rosalita Chessman [facility] August 01, 2014 9:45 AM    CC:  CPT CODES: ICD CODES:  The ICD and CPT codes recommended by this software are interpretations from the data that the clinical staff has captured with the software.  The verification of the translation of this report to the ICD and CPT codes and modifiers is the sole responsibility of the health care institution and practicing physician where this report was generated.  [employer] [place]. will not be held responsible for the validity of the ICD and CPT codes included on this report.  AMA assumes no liability for data contained or not contained herein. CPT is a Designer, television/film set of the [employer].  PATIENT NAME:  Bethany Espinoza, Bethany Espinoza MR#: [id number]

## 2014-07-20 NOTE — Interval H&P Note (Signed)
History and Physical Interval Note:  07/20/2014 8:38 AM  Rebecca Eaton  has presented today for surgery, with the diagnosis of unintentional weight loss  The various methods of treatment have been discussed with the patient and family. After consideration of risks, benefits and other options for treatment, the patient has consented to  Procedure(s) with comments: COLONOSCOPY (N/A) - 830  ESOPHAGOGASTRODUODENOSCOPY (EGD) (N/A) as a surgical intervention .  The patient's history has been reviewed, patient examined, no change in status, stable for surgery.  I have reviewed the patient's chart and labs.  Questions were answered to the patient's satisfaction.     Arlind Klingerman  No change. Colonoscopy with possible EGD to follow per plan.The risks, benefits, limitations, imponderables and alternatives regarding both EGD and colonoscopy have been reviewed with the patient. Questions have been answered. All parties agreeable.

## 2014-07-23 ENCOUNTER — Encounter (HOSPITAL_COMMUNITY): Payer: Self-pay | Admitting: Internal Medicine

## 2014-07-24 ENCOUNTER — Encounter: Payer: Self-pay | Admitting: Internal Medicine

## 2014-07-24 ENCOUNTER — Telehealth: Payer: Self-pay

## 2014-07-24 NOTE — Telephone Encounter (Signed)
PATIENT SCHEDULED 10/19/14 WITH [facility]

## 2014-07-24 NOTE — Telephone Encounter (Signed)
Letter mailed to the pt. 

## 2014-07-24 NOTE — Telephone Encounter (Signed)
Per RMR-  Send letter to patient.  Send copy of letter with path to referring provider and PCP.   Patient needs an office visit with extender in about 6 weeks to follow-up weight loss, etc.

## 2014-10-19 ENCOUNTER — Encounter: Payer: Self-pay | Admitting: Gastroenterology

## 2014-10-19 ENCOUNTER — Ambulatory Visit (INDEPENDENT_AMBULATORY_CARE_PROVIDER_SITE_OTHER): Payer: 59 | Admitting: Gastroenterology

## 2014-10-19 VITALS — BP 180/100 | HR 91 | Temp 99.1°F | Ht 66.0 in | Wt 111.6 lb

## 2014-10-19 DIAGNOSIS — R634 Abnormal weight loss: Secondary | ICD-10-CM | POA: Diagnosis not present

## 2014-10-19 DIAGNOSIS — K279 Peptic ulcer, site unspecified, unspecified as acute or chronic, without hemorrhage or perforation: Secondary | ICD-10-CM

## 2014-10-19 DIAGNOSIS — K2100 Gastro-esophageal reflux disease with esophagitis, without bleeding: Secondary | ICD-10-CM

## 2014-10-19 NOTE — Patient Instructions (Signed)
1. Continue pantoprazole twice daily for stomach ulcers and reflux. 2. Please have your labs done any day between 8am and 10am, you cannot eat or drink after midnight. 3. Your blood pressure is elevated today. We checked it 3 times, 185/101 machine, 172/90 manual, 180/100 manual. We recommend you call your family doctor today about this since you have a history of high blood pressure and no longer on medication. If you have headache, chest pain, numbness/tingling in your extremities you need to go straight to the ER or call 911. 4. We will call you once your labs are back, usually takes 7 to 10 business days. Based on results you may need further work up for weight loss. You should consider follow up upper endoscopy to make sure your ulcers healed.  5. Continue to avoid ibuprofen, aleve, naprosyn, aspirin for now. You may take tylenol for headache.

## 2014-10-19 NOTE — Assessment & Plan Note (Signed)
History of gastric ulcers in the setting of aspirin powders use. Due for three-month surveillance EGD to verify gastric ulcer healing. Patient declines at this time that may be open to the idea based on upcoming blood work results. Concerned about medical bills. Situation is difficult because patient really was asymptomatic and therefore cannot follow resolution of erosive reflux esophagitis or gastric ulcers clinically. For now would consider continuing pantoprazole 40 mg twice daily. For ongoing unintentional weight loss, recommended CT abdomen and pelvis to rule out malignancy. Chest CT up to date. Patient very frustrated in not wanting to pursue at this time. States she just wants steroids to help her gain weight.  We will check thyroid status, rule out adrenal insufficiency and then go from there. Patient seemed okay with this approach.  She had significantly elevated blood pressures in the office today. History of hypertension but apparently came off the medication about a year ago for unclear reasons. I suggested that she talked her PCP today because of blood pressure. She is not interested in this or ER evaluation. She wants to go home and check today with blood pressure cuff and states if he remains elevated she'll recheck her PCP. Guidelines provided as far as what she should consider going to the emergency department for and what normal or acceptable values would be. Patient voiced understanding.

## 2014-10-19 NOTE — Progress Notes (Signed)
Primary Care Physician: Purvis Kilts, MD  Primary Gastroenterologist:  Garfield Cornea, MD   Chief Complaint  Patient presents with  . Follow-up    wt loss/ still losing    HPI: Bethany Espinoza is a 49 y.o. female here for follow-up. She underwent ileocolonoscopy in February 2016 for weight loss. She had scattered left-sided diverticula, distal 5 cm of the terminal ileum appeared normal. Mid descending colon polyp removed, benign findings. EGD at the same time showed erosive reflux esophagitis, multiple gastric ulcers and erosions, chronic gastritis but no H. pylori on biopsies. Advised to stop all forms of aspirin and NSAIDs and began protonic 40 mg twice daily. She presents for follow-up.  She continues to lose weight. Down another 11 pounds since February. Weight 144 pounds back in August 2014.  Patient states the only symptom she had before was vomiting. This is now gone. She states that she eats very well. Usually 2 big meals a day and then several snacks. Denies any heartburn, dysphagia, constipation, diarrhea, melanoma, rectal bleeding. She started a new job back in November 2015 and is very active, on her feet all day. Was losing weight before this. No longer on aspirin products. She takes an occasional ibuprofen for headache not realizing it was an NSAID. According to her previous office note, she told Laban Emperor, a nurse practitioner, she had syncopal episodes every few months over the past couple of years. She refused further workup of this. Patient was admitted back in 2013 with a syncopal episode which was felt to be related to pneumonia and dehydration at that time. She was seen by cardiology at that time he performed echocardiogram but no other workup needed. CT of the head in 2013 unremarkable. Patient had a chest CT back in December 2015 which showed mild emphysema.   Patient states she's taken about going to her PCP and ask for steroids to help her gain weight. She  was frustrated today with her concern about her elevated blood pressures and when she was recommended to have a CT of the abdomen and pelvis for unexplained weight loss as well as a follow-up EGD to verify gastric ulcer healing per guidelines. Most of this is financially based frustration, not wanting to have a lot of medical bills. She seems to have a poor understanding of the significance of uncontrolled hypertension and unexplained weight loss.   Current Outpatient Prescriptions  Medication Sig Dispense Refill  . albuterol (PROVENTIL HFA;VENTOLIN HFA) 108 (90 BASE) MCG/ACT inhaler Inhale 2 puffs into the lungs every 6 (six) hours as needed. Shortness of breath    . fluticasone (FLOVENT DISKUS) 50 MCG/BLIST diskus inhaler Inhale 1 puff into the lungs 2 (two) times daily. As needed    . lamoTRIgine (LAMICTAL) 150 MG tablet Take 300 mg by mouth daily.    . pantoprazole (PROTONIX) 40 MG tablet Take 40 mg by mouth 2 (two) times daily.    . ziprasidone (GEODON) 60 MG capsule Take 60 mg by mouth daily.    . metoprolol tartrate (LOPRESSOR) 12.5 mg TABS Take 0.5 tablets (12.5 mg total) by mouth 2 (two) times daily. 30 tablet 0   No current facility-administered medications for this visit.    Allergies as of 10/19/2014 - Review Complete 10/19/2014  Allergen Reaction Noted  . Codeine Hives and Rash 07/28/2011    ROS:  General: Negative for anorexia, fever, chills, fatigue, weakness. See history of present illness ENT: Negative for hoarseness, difficulty swallowing ,  nasal congestion. CV: Negative for chest pain, angina, palpitations, dyspnea on exertion, peripheral edema.  Respiratory: Negative for dyspnea at rest, dyspnea on exertion, cough, sputum, wheezing.  GI: See history of present illness. GU:  Negative for dysuria, hematuria, urinary incontinence, urinary frequency, nocturnal urination.  Endo: See history of present illness   Physical Examination:   BP 180/100 mmHg  Pulse 91   Temp(Src) 99.1 F (37.3 C) (Oral)  Ht 5\' 6"  (1.676 m)  Wt 111 lb 9.6 oz (50.621 kg)  BMI 18.02 kg/m2  blood pressure checked 3 times, twice manually with consistent values around 180/100  General: Well-nourished, well-developed in no acute distress.  Eyes: No icterus. Mouth: Oropharyngeal mucosa moist and pink , no lesions erythema or exudate. Lungs: Clear to auscultation bilaterally.  Heart: Regular rate and rhythm, no murmurs rubs or gallops.  Abdomen: Bowel sounds are normal, nontender, nondistended, no hepatosplenomegaly or masses, no abdominal bruits or hernia , no rebound or guarding.   Extremities: No lower extremity edema. No clubbing or deformities. Neuro: Alert and oriented x 4   Skin: Warm and dry, no jaundice.   Psych: Alert and cooperative, normal mood and affect.  Imaging Studies: No results found.

## 2014-10-30 ENCOUNTER — Telehealth: Payer: Self-pay

## 2014-10-30 NOTE — Telephone Encounter (Signed)
Received lab results from Teresita on [facility] desk.

## 2014-11-05 NOTE — Progress Notes (Signed)
cc'ed to pcp °

## 2014-11-08 ENCOUNTER — Emergency Department (HOSPITAL_COMMUNITY): Payer: 59

## 2014-11-08 ENCOUNTER — Inpatient Hospital Stay (HOSPITAL_COMMUNITY)
Admit: 2014-11-08 | Discharge: 2014-11-08 | Disposition: A | Discharge status: Home / Self Care | Payer: 59 | Attending: Family Medicine | Admitting: Family Medicine

## 2014-11-08 ENCOUNTER — Observation Stay (HOSPITAL_COMMUNITY): Payer: 59

## 2014-11-08 ENCOUNTER — Encounter (HOSPITAL_COMMUNITY): Payer: Self-pay

## 2014-11-08 ENCOUNTER — Inpatient Hospital Stay (HOSPITAL_COMMUNITY)
Admission: EM | Admit: 2014-11-08 | Discharge: 2014-11-10 | DRG: 312 | Disposition: A | Discharge status: Home / Self Care | Payer: 59 | Attending: Internal Medicine | Admitting: Internal Medicine

## 2014-11-08 DIAGNOSIS — K279 Peptic ulcer, site unspecified, unspecified as acute or chronic, without hemorrhage or perforation: Secondary | ICD-10-CM

## 2014-11-08 DIAGNOSIS — R55 Syncope and collapse: Secondary | ICD-10-CM

## 2014-11-08 DIAGNOSIS — E871 Hypo-osmolality and hyponatremia: Secondary | ICD-10-CM | POA: Diagnosis not present

## 2014-11-08 DIAGNOSIS — I951 Orthostatic hypotension: Secondary | ICD-10-CM | POA: Diagnosis present

## 2014-11-08 DIAGNOSIS — J45909 Unspecified asthma, uncomplicated: Secondary | ICD-10-CM | POA: Diagnosis present

## 2014-11-08 DIAGNOSIS — Z79899 Other long term (current) drug therapy: Secondary | ICD-10-CM | POA: Diagnosis not present

## 2014-11-08 DIAGNOSIS — J449 Chronic obstructive pulmonary disease, unspecified: Secondary | ICD-10-CM | POA: Diagnosis present

## 2014-11-08 DIAGNOSIS — R634 Abnormal weight loss: Secondary | ICD-10-CM | POA: Diagnosis present

## 2014-11-08 DIAGNOSIS — Z90710 Acquired absence of both cervix and uterus: Secondary | ICD-10-CM

## 2014-11-08 DIAGNOSIS — Z8249 Family history of ischemic heart disease and other diseases of the circulatory system: Secondary | ICD-10-CM | POA: Diagnosis not present

## 2014-11-08 DIAGNOSIS — I10 Essential (primary) hypertension: Secondary | ICD-10-CM | POA: Diagnosis present

## 2014-11-08 DIAGNOSIS — Z885 Allergy status to narcotic agent status: Secondary | ICD-10-CM | POA: Diagnosis not present

## 2014-11-08 DIAGNOSIS — Z681 Body mass index (BMI) 19 or less, adult: Secondary | ICD-10-CM | POA: Diagnosis not present

## 2014-11-08 DIAGNOSIS — Z7952 Long term (current) use of systemic steroids: Secondary | ICD-10-CM

## 2014-11-08 DIAGNOSIS — F319 Bipolar disorder, unspecified: Secondary | ICD-10-CM | POA: Diagnosis not present

## 2014-11-08 DIAGNOSIS — F17210 Nicotine dependence, cigarettes, uncomplicated: Secondary | ICD-10-CM | POA: Diagnosis present

## 2014-11-08 LAB — COMPREHENSIVE METABOLIC PANEL
ALBUMIN: 3.5 g/dL (ref 3.5–5.0)
ALK PHOS: 50 U/L (ref 38–126)
ALT: 20 U/L (ref 14–54)
ALT: 17 U/L (ref 14–54)
AST: 41 U/L (ref 15–41)
AST: 34 U/L (ref 15–41)
Albumin: 4 g/dL (ref 3.5–5.0)
Alkaline Phosphatase: 56 U/L (ref 38–126)
Anion gap: 13 (ref 5–15)
Anion gap: 9 (ref 5–15)
BUN: 15 mg/dL (ref 6–20)
BUN: 13 mg/dL (ref 6–20)
CALCIUM: 9.3 mg/dL (ref 8.9–10.3)
CALCIUM: 8.7 mg/dL — AB (ref 8.9–10.3)
CHLORIDE: 88 mmol/L — AB (ref 101–111)
CO2: 24 mmol/L (ref 22–32)
CO2: 26 mmol/L (ref 22–32)
Chloride: 93 mmol/L — ABNORMAL LOW (ref 101–111)
Creatinine, Ser: 0.91 mg/dL (ref 0.44–1.00)
Creatinine, Ser: 0.81 mg/dL (ref 0.44–1.00)
GFR calc Af Amer: >60 mL/min (ref >60)
GFR calc Af Amer: >60 mL/min (ref >60)
GFR calc non Af Amer: >60 mL/min (ref >60)
Glucose, Bld: 92 mg/dL (ref 65–99)
Glucose, Bld: 96 mg/dL (ref 65–99)
POTASSIUM: 3.5 mmol/L (ref 3.5–5.1)
Potassium: 3.8 mmol/L (ref 3.5–5.1)
SODIUM: 128 mmol/L — AB (ref 135–145)
Sodium: 125 mmol/L — ABNORMAL LOW (ref 135–145)
TOTAL PROTEIN: 7.2 g/dL (ref 6.5–8.1)
TOTAL PROTEIN: 6.6 g/dL (ref 6.5–8.1)
Total Bilirubin: 0.8 mg/dL (ref 0.3–1.2)
Total Bilirubin: 0.8 mg/dL (ref 0.3–1.2)

## 2014-11-08 LAB — CBC WITH DIFFERENTIAL/PLATELET
BASOS ABS: 0 10*3/uL (ref 0.0–0.1)
Basophils Relative: 0 % (ref 0–1)
EOS PCT: 0 % (ref 0–5)
Eosinophils Absolute: 0 10*3/uL (ref 0.0–0.7)
HCT: 37.4 % (ref 36.0–46.0)
Hemoglobin: 13.3 g/dL (ref 12.0–15.0)
LYMPHS PCT: 18 % (ref 12–46)
Lymphs Abs: 1.7 10*3/uL (ref 0.7–4.0)
MCH: 31.6 pg (ref 26.0–34.0)
MCHC: 35.6 g/dL (ref 30.0–36.0)
MCV: 88.8 fL (ref 78.0–100.0)
MONOS PCT: 8 % (ref 3–12)
Monocytes Absolute: 0.8 10*3/uL (ref 0.1–1.0)
Neutro Abs: 6.9 10*3/uL (ref 1.7–7.7)
Neutrophils Relative %: 74 % (ref 43–77)
Platelets: 302 10*3/uL (ref 150–400)
RBC: 4.21 MIL/uL (ref 3.87–5.11)
RDW: 12.5 % (ref 11.5–15.5)
WBC: 9.4 10*3/uL (ref 4.0–10.5)

## 2014-11-08 LAB — CBC
HEMATOCRIT: 36.8 % (ref 36.0–46.0)
HEMOGLOBIN: 12.8 g/dL (ref 12.0–15.0)
MCH: 31.4 pg (ref 26.0–34.0)
MCHC: 34.8 g/dL (ref 30.0–36.0)
MCV: 90.2 fL (ref 78.0–100.0)
PLATELETS: 304 10*3/uL (ref 150–400)
RBC: 4.08 MIL/uL (ref 3.87–5.11)
RDW: 12.5 % (ref 11.5–15.5)
WBC: 8.1 10*3/uL (ref 4.0–10.5)

## 2014-11-08 LAB — I-STAT CG4 LACTIC ACID, ED: Lactic Acid, Venous: 2.16 mmol/L (ref 0.5–2.0)

## 2014-11-08 LAB — GLUCOSE, CAPILLARY: GLUCOSE-CAPILLARY: 95 mg/dL (ref 65–99)

## 2014-11-08 LAB — TSH: TSH: 2.823 u[IU]/mL (ref 0.350–4.500)

## 2014-11-08 LAB — TROPONIN I: Troponin I: <0.03 ng/mL (ref <0.031)

## 2014-11-08 LAB — CORTISOL: CORTISOL PLASMA: 8.6 ug/dL

## 2014-11-08 LAB — OSMOLALITY: Osmolality: 261 mOsm/kg — ABNORMAL LOW (ref 275–300)

## 2014-11-08 LAB — SODIUM, URINE, RANDOM: Sodium, Ur: 18 mmol/L

## 2014-11-08 LAB — OSMOLALITY, URINE: Osmolality, Ur: 85 mOsm/kg — ABNORMAL LOW (ref 390–1090)

## 2014-11-08 LAB — D-DIMER, QUANTITATIVE (NOT AT ARMC): D DIMER QUANT: 0.33 ug{FEU}/mL (ref 0.00–0.48)

## 2014-11-08 MED ORDER — LAMOTRIGINE 100 MG PO TABS
300.0000 mg | ORAL_TABLET | Freq: Every day | ORAL | Status: DC
  Administered 2014-11-08 – 2014-11-10 (×3): 300 mg via ORAL
  Filled 2014-11-08 (×6): qty 3

## 2014-11-08 MED ORDER — SODIUM CHLORIDE 0.9 % IJ SOLN
3.0000 mL | Freq: Two times a day (BID) | INTRAMUSCULAR | Status: DC
  Administered 2014-11-09 – 2014-11-10 (×2): 3 mL via INTRAVENOUS

## 2014-11-08 MED ORDER — SODIUM CHLORIDE 0.9 % IV SOLN
1000.0000 mL | INTRAVENOUS | Status: DC
  Administered 2014-11-09 (×2): 1000 mL via INTRAVENOUS

## 2014-11-08 MED ORDER — PANTOPRAZOLE SODIUM 40 MG PO TBEC
40.0000 mg | DELAYED_RELEASE_TABLET | Freq: Two times a day (BID) | ORAL | Status: DC
  Administered 2014-11-08 – 2014-11-10 (×4): 40 mg via ORAL
  Filled 2014-11-08 (×4): qty 1

## 2014-11-08 MED ORDER — SODIUM CHLORIDE 0.9 % IV SOLN
INTRAVENOUS | Status: AC
  Administered 2014-11-08: 14:00:00 via INTRAVENOUS

## 2014-11-08 MED ORDER — SODIUM CHLORIDE 0.9 % IV BOLUS (SEPSIS): 1000.0000 mL | Freq: Once | INTRAVENOUS | Status: DC

## 2014-11-08 MED ORDER — BUDESONIDE 0.25 MG/2ML IN SUSP
0.2500 mg | Freq: Two times a day (BID) | RESPIRATORY_TRACT | Status: DC
  Administered 2014-11-08 – 2014-11-10 (×4): 0.25 mg via RESPIRATORY_TRACT
  Filled 2014-11-08 (×8): qty 2

## 2014-11-08 MED ORDER — ZIPRASIDONE HCL 20 MG PO CAPS
60.0000 mg | ORAL_CAPSULE | Freq: Every day | ORAL | Status: DC
  Administered 2014-11-08 – 2014-11-10 (×3): 60 mg via ORAL
  Filled 2014-11-08 (×5): qty 3

## 2014-11-08 MED ORDER — SODIUM CHLORIDE 0.9 % IV SOLN
1000.0000 mL | Freq: Once | INTRAVENOUS | Status: AC
  Administered 2014-11-08: 1000 mL via INTRAVENOUS

## 2014-11-08 NOTE — ED Notes (Signed)
Pt states for over a month and a half she has been having syncopal episodes, states she has seen her pmd for same without diagnosis.  Pt states she "passed out twice today"  Pt states she is always dizzy and lightheaded, unsteady on her feet.  Pt denies pain

## 2014-11-08 NOTE — Progress Notes (Signed)
EEG Completed; Results Pending  

## 2014-11-08 NOTE — ED Provider Notes (Signed)
CSN: [id number]     Arrival date & time 11/08/14  0240 History   First MD Initiated Contact with Patient 11/08/14 0245     Chief Complaint  Patient presents with  . Loss of Consciousness     (Consider location/radiation/quality/duration/timing/severity/associated sxs/prior Treatment) Patient is a 49 y.o. female presenting with syncope. The history is provided by the patient.  Loss of Consciousness She had a syncopal episode tonight. She states that she was getting ready to go to work and was walking away from the bathroom and the next thing she knew she was on the floor. She had absolutely no warning that she was going to pass out. She denies lightheadedness, chest pain, palpitations. There's been no nausea or vomiting. She denies bowel or bladder incontinence. She is had multiple episodes of syncope over the last month. Of note, she had been admitted for a syncopal episode 3 years ago but that was found to be related to pneumonia and dehydration. She has also been seeing the gastroenterologist for unexplained weight loss of approximately 35 pounds. She states that her PCP has done some blood tests but she does not know exactly what tests were done. She does hit her head on the floor when she passes out but she denies any actual injury.  Past Medical History  Diagnosis Date  . Hypertension   . Asthma   . Mental disorder     bipolar, sees Dr Wylene Simmer  . COPD (chronic obstructive pulmonary disease)    Past Surgical History  Procedure Laterality Date  . Abdominal hysterectomy    . Colonoscopy      remote past in [place].   . Colonoscopy N/A 07/20/2014    RMR: Colonic diverticulosis. Single colonic polyp removed as described above  . Esophagogastroduodenoscopy N/A 07/20/2014    RMR: Erosive reflux esophagitis. Hiatal hernia. Multiple gastric ulcers and erosinons status post biopsy.   Family History  Problem Relation Age of Onset  . Cancer Father     lung  . Cancer Mother     lymphoma   . Stroke Mother   . Hypertension Mother   . Heart murmur Mother   . Cancer Maternal Grandfather     prostate  . Colon cancer Neg Hx    History  Substance Use Topics  . Smoking status: Current Every Day Smoker -- 0.50 packs/day for 29 years    Types: Cigarettes  . Smokeless tobacco: Never Used     Comment: a little over half pack daily  . Alcohol Use: 0.0 oz/week    0 Standard drinks or equivalent per week     Comment: social alcohol   OB History    Gravida Para Term Preterm AB TAB SAB Ectopic Multiple Living   1    1  1         Review of Systems  Cardiovascular: Positive for syncope.  All other systems reviewed and are negative.     Allergies  Codeine  Home Medications   Prior to Admission medications   Medication Sig Start Date End Date Taking? Authorizing Provider  albuterol (PROVENTIL HFA;VENTOLIN HFA) 108 (90 BASE) MCG/ACT inhaler Inhale 2 puffs into the lungs every 6 (six) hours as needed. Shortness of breath    Historical Provider, MD  fluticasone (FLOVENT DISKUS) 50 MCG/BLIST diskus inhaler Inhale 1 puff into the lungs 2 (two) times daily. As needed    Historical Provider, MD  lamoTRIgine (LAMICTAL) 150 MG tablet Take 300 mg by mouth daily.    Historical  Provider, MD  metoprolol tartrate (LOPRESSOR) 12.5 mg TABS Take 0.5 tablets (12.5 mg total) by mouth 2 (two) times daily. 07/30/11 07/29/12  Kathie Dike, MD  pantoprazole (PROTONIX) 40 MG tablet Take 40 mg by mouth 2 (two) times daily.    Historical Provider, MD  ziprasidone (GEODON) 60 MG capsule Take 60 mg by mouth daily.    Historical Provider, MD   BP 155/87 mmHg  Pulse 80  Temp(Src) 98.6 F (37 C) (Oral)  Resp 22  Ht 5\' 6"  (1.676 m)  Wt 106 lb (48.081 kg)  BMI 17.12 kg/m2  SpO2 100% Physical Exam  Nursing note and vitals reviewed.  49 year old female, resting comfortably and in no acute distress. Vital signs are significant for hypertension and borderline tachypnea. Oxygen saturation is 100%, which  is normal. Head is normocephalic and atraumatic. PERRLA, EOMI. Oropharynx is clear. Fundi show no hemorrhage, exudate, or papilledema. Neck is nontender and supple without adenopathy or JVD. There are no carotid bruits. Back is nontender and there is no CVA tenderness. Lungs are clear without rales, wheezes, or rhonchi. Chest is nontender. Heart has regular rate and rhythm without murmur. Abdomen is soft, flat, nontender without masses or hepatosplenomegaly and peristalsis is normoactive. Extremities have no cyanosis or edema, full range of motion is present. Skin is warm and dry without rash. Neurologic: Mental status is normal, cranial nerves are intact, there are no motor or sensory deficits.  ED Course  Procedures (including critical care time) Labs Review Results for orders placed or performed during the hospital encounter of 11/08/14  Troponin I  Result Value Ref Range   Troponin I <0.03 <0.031 ng/mL  Comprehensive metabolic panel  Result Value Ref Range   Sodium 125 (L) 135 - 145 mmol/L   Potassium 3.8 3.5 - 5.1 mmol/L   Chloride 88 (L) 101 - 111 mmol/L   CO2 24 22 - 32 mmol/L   Glucose, Bld 92 65 - 99 mg/dL   BUN 15 6 - 20 mg/dL   Creatinine, Ser 0.91 0.44 - 1.00 mg/dL   Calcium 9.3 8.9 - 10.3 mg/dL   Total Protein 7.2 6.5 - 8.1 g/dL   Albumin 4.0 3.5 - 5.0 g/dL   AST 41 15 - 41 U/L   ALT 20 14 - 54 U/L   Alkaline Phosphatase 56 38 - 126 U/L   Total Bilirubin 0.8 0.3 - 1.2 mg/dL   GFR calc non Af Amer >60 >60 mL/min   GFR calc Af Amer >60 >60 mL/min   Anion gap 13 5 - 15  CBC with Differential  Result Value Ref Range   WBC 9.4 4.0 - 10.5 K/uL   RBC 4.21 3.87 - 5.11 MIL/uL   Hemoglobin 13.3 12.0 - 15.0 g/dL   HCT 37.4 36.0 - 46.0 %   MCV 88.8 78.0 - 100.0 fL   MCH 31.6 26.0 - 34.0 pg   MCHC 35.6 30.0 - 36.0 g/dL   RDW 12.5 11.5 - 15.5 %   Platelets 302 150 - 400 K/uL   Neutrophils Relative % 74 43 - 77 %   Neutro Abs 6.9 1.7 - 7.7 K/uL   Lymphocytes Relative  18 12 - 46 %   Lymphs Abs 1.7 0.7 - 4.0 K/uL   Monocytes Relative 8 3 - 12 %   Monocytes Absolute 0.8 0.1 - 1.0 K/uL   Eosinophils Relative 0 0 - 5 %   Eosinophils Absolute 0.0 0.0 - 0.7 K/uL   Basophils Relative 0  0 - 1 %   Basophils Absolute 0.0 0.0 - 0.1 K/uL  D-dimer, quantitative (not at [facility])  Result Value Ref Range   D-Dimer, Quant 0.33 0.00 - 0.48 ug/mL-FEU  I-Stat CG4 Lactic Acid, ED  Result Value Ref Range   Lactic Acid, Venous 2.16 (HH) 0.5 - 2.0 mmol/L   Imaging Review Dg Chest 2 View  11/08/2014   CLINICAL DATA:  Syncopal episodes for 1.5 months.  EXAM: CHEST  2 VIEW  COMPARISON:  CT chest 05/14/2014  FINDINGS: Hyperinflation. The heart size and mediastinal contours are within normal limits. Both lungs are clear. The visualized skeletal structures are unremarkable.  IMPRESSION: No active cardiopulmonary disease.   Electronically Signed   By: Lucienne Capers M.D.   On: 11/08/2014 04:02   Ct Head Wo Contrast  11/08/2014   CLINICAL DATA:  Syncopal episodes for a month and a half. Has seen primary care for same without diagnosis. Passed out twice today. Dizziness and lightheadedness. Unsteady on feet.  EXAM: CT HEAD WITHOUT CONTRAST  TECHNIQUE: Contiguous axial images were obtained from the base of the skull through the vertex without intravenous contrast.  COMPARISON:  07/28/2011  FINDINGS: Mild cerebral atrophy. No ventricular dilatation. No mass effect or midline shift. No abnormal extra-axial fluid collections. Gray-white matter junctions are distinct. Basal cisterns are not effaced. No evidence of acute intracranial hemorrhage. No depressed skull fractures. Visualized paranasal sinuses and mastoid air cells are not opacified.  IMPRESSION: No acute intracranial abnormalities.  Mild atrophy.   Electronically Signed   By: Lucienne Capers M.D.   On: 11/08/2014 04:01     EKG Interpretation   Date/Time:  Thursday November 08 2014 03:00:31 EDT Ventricular Rate:  88 PR Interval:   119 QRS Duration: 85 QT Interval:  397 QTC Calculation: 480 R Axis:   74 Text Interpretation:  Sinus rhythm Borderline short PR interval Right  atrial enlargement Baseline wander in lead(s) V3 When compared with ECG of  07/29/2011, No significant change was found Confirmed by [facility]  MD, Ebubechukwu Jedlicka  ([id number]) on 11/08/2014 3:42:08 AM      MDM   Final diagnoses:  Syncope  Hyponatremia  Orthostatic hypotension    Syncope of uncertain cause. Old records reviewed confirming hospitalization 3 years ago for syncope related to dehydration and pneumonia. She has had GI evaluation for her unexplained weight loss including colonoscopy and upper endoscopy. No clues to cause of syncope on physical examination. Workup has been initiated.  Patient is noted to have significant orthostatic drop in blood pressure and rise in pulse although she never truly became hypotensive. She did notice that she was dizzy when she stood up. However, her episode of syncope was not orthostatic in nature. Lactic acid is minimally elevated. She is noted to have significant hyponatremia with sodium 125. She is not on any diaphoretic to account for that. She is started with intravenous hydration. Case is discussed with Dr. Shanon Brow of [facility] agrees to admit the patient under observation status.  Delora Fuel, MD 63/[id number]

## 2014-11-08 NOTE — Care Management Note (Signed)
Case Management Note  Patient Details  Name: Bethany Espinoza MRN: [id number] Date of Birth: [birth year]  Subjective/Objective:                  Pt admitted from home with syncope. Pt lives alone and will return home at discharge. Pt is independent with ADL's.  Action/Plan: Will continue to follow for discharge planning needs.  Expected Discharge Date:                  Expected Discharge Plan:  Home/Self Care  In-House Referral:  NA  Discharge planning Services  CM Consult  Post Acute Care Choice:    Choice offered to:     DME Arranged:    DME Agency:     HH Arranged:    HH Agency:     Status of Service:  In process, will continue to follow  Medicare Important Message Given:    Date Medicare IM Given:    Medicare IM give by:    Date Additional Medicare IM Given:    Additional Medicare Important Message give by:     If discussed at [place] of Stay Meetings, dates discussed:    Additional Comments:  Joylene Draft, RN 11/08/2014, 2:23 PM

## 2014-11-08 NOTE — Progress Notes (Signed)
Patient admitted by Dr. Shanon Brow earlier this morning.  Patient seen and examined.  She has been admitted with generalized weakness and recurrent episodes of syncope. She reports having syncopal episodes for several years now, with the last few weeks have become more frequent. She denies any chest pain or shortness of breath. Occasionally the syncopal episodes are associated with presyncopal lightheadedness. Often, the patient syncopizes without any forewarning. She does occasionally notices palpitations. She denies any urinary or bowel incontinence. She has not been told that she has any muscle jerking. The patient is unsure how long her syncopal episodes last.  Etiology of her syncope is not entirely clear. Echocardiogram has been ordered. EEG has also been done with report pending. She is somewhat hyponatremic, but does not appear to be clinically dehydrated. She may benefit from further cardiac workup including Holter monitor. Will request cardiology consultation.  Her hyponatremia is likely related to polydipsia. She reports drinking 140-160 ounces of water a day. Urine osmolality is low as his serum osmolality. Will fluid restrict. TSH is normal. Will check cortisol. The patient reports taking prednisone for the past week which was prescribed by her primary care physician for as an appetite stimulant. She reports progressive weight loss. We'll check HIV.  Bethany Espinoza

## 2014-11-08 NOTE — H&P (Signed)
PCP:   Purvis Kilts, MD   Chief Complaint:  Passing out  HPI: 49 yo female h/o htn, significant weight loss in last 3 months, copd comes in today with several episodes of syncope.  Pt reports she has had too many too count unwitnessed syncopal episodes over the last 4-6 weeks.  She says sometimes it occurs soon after getting up, she gets dizzy and passes out.  Other times however like today she was up walking doing stuff for an hour, next thing she was lying on the floor.  She denies any real prodrome, no chest pain, no palpitations.  She has been loosing a lot of weight in the last 3 months, which is being worked up by her pcp and etiology of that has not been revealed.  Denies any abd pain.  No hemoptysis.  No n/v/d. No blood in stool.  No fevers.  No headache.  No other focal neurological symptoms.  Has not had any bowel or bladder incontinence when these events happen.  Has not had any tongue bites/soreness after these episodes.  She does say when she wakes up she feels very weak and has to crawl to go lay down somewhere.    Review of Systems:  Positive and negative as per HPI otherwise all other systems are negative  Past Medical History: Past Medical History  Diagnosis Date  . Hypertension   . Asthma   . Mental disorder     bipolar, sees Dr Wylene Simmer  . COPD (chronic obstructive pulmonary disease)    Past Surgical History  Procedure Laterality Date  . Abdominal hysterectomy    . Colonoscopy      remote past in [place].   . Colonoscopy N/A 07/20/2014    RMR: Colonic diverticulosis. Single colonic polyp removed as described above  . Esophagogastroduodenoscopy N/A 07/20/2014    RMR: Erosive reflux esophagitis. Hiatal hernia. Multiple gastric ulcers and erosinons status post biopsy.    Medications: Prior to Admission medications   Medication Sig Start Date End Date Taking? Authorizing Provider  lamoTRIgine (LAMICTAL) 150 MG tablet Take 300 mg by mouth daily.   Yes Historical  Provider, MD  pantoprazole (PROTONIX) 40 MG tablet Take 40 mg by mouth 2 (two) times daily.   Yes Historical Provider, MD  predniSONE (DELTASONE) 10 MG tablet Take 10 mg by mouth 4 (four) times daily.   Yes Historical Provider, MD  ziprasidone (GEODON) 60 MG capsule Take 60 mg by mouth daily.   Yes Historical Provider, MD  albuterol (PROVENTIL HFA;VENTOLIN HFA) 108 (90 BASE) MCG/ACT inhaler Inhale 2 puffs into the lungs every 6 (six) hours as needed. Shortness of breath    Historical Provider, MD  fluticasone (FLOVENT DISKUS) 50 MCG/BLIST diskus inhaler Inhale 1 puff into the lungs 2 (two) times daily. As needed    Historical Provider, MD  metoprolol tartrate (LOPRESSOR) 12.5 mg TABS Take 0.5 tablets (12.5 mg total) by mouth 2 (two) times daily. 07/30/11 07/29/12  Kathie Dike, MD    Allergies:   Allergies  Allergen Reactions  . Codeine Hives and Rash    Not in right state of mind    Social History:  reports that she has been smoking Cigarettes.  She has a 14.5 pack-year smoking history. She has never used smokeless tobacco. She reports that she drinks alcohol. She reports that she does not use illicit drugs.  Family History: Family History  Problem Relation Age of Onset  . Cancer Father     lung  .  Cancer Mother     lymphoma  . Stroke Mother   . Hypertension Mother   . Heart murmur Mother   . Cancer Maternal Grandfather     prostate  . Colon cancer Neg Hx     Physical Exam: Filed Vitals:   11/08/14 0252 11/08/14 0300 11/08/14 0330 11/08/14 0400  BP: 155/87 147/100 150/129 164/87  Pulse: 80 77 78 81  Temp: 98.6 F (37 C)     TempSrc: Oral     Resp: 22 18 30 23   Height: 5\' 6"  (1.676 m)     Weight: 48.081 kg (106 lb)     SpO2: 100% 100% 91% 100%   General appearance: alert, cooperative, cachectic and no distress Head: Normocephalic, without obvious abnormality, atraumatic Eyes: negative Nose: Nares normal. Septum midline. Mucosa normal. No drainage or sinus  tenderness. Neck: no JVD and supple, symmetrical, trachea midline Lungs: clear to auscultation bilaterally Heart: regular rate and rhythm, S1, S2 normal, no murmur, click, rub or gallop Abdomen: soft, non-tender; bowel sounds normal; no masses,  no organomegaly Extremities: extremities normal, atraumatic, no cyanosis or edema Pulses: 2+ and symmetric Skin: Skin color, texture, turgor normal. No rashes or lesions Neurologic: Grossly normal    Labs on Admission:   Recent Labs  11/08/14 0305  NA 125*  K 3.8  CL 88*  CO2 24  GLUCOSE 92  BUN 15  CREATININE 0.91  CALCIUM 9.3    Recent Labs  11/08/14 0305  AST 41  ALT 20  ALKPHOS 56  BILITOT 0.8  PROT 7.2  ALBUMIN 4.0    Recent Labs  11/08/14 0305  WBC 9.4  NEUTROABS 6.9  HGB 13.3  HCT 37.4  MCV 88.8  PLT 302    Recent Labs  11/08/14 0305  TROPONINI <0.03   Radiological Exams on Admission: Dg Chest 2 View  11/08/2014   CLINICAL DATA:  Syncopal episodes for 1.5 months.  EXAM: CHEST  2 VIEW  COMPARISON:  CT chest 05/14/2014  FINDINGS: Hyperinflation. The heart size and mediastinal contours are within normal limits. Both lungs are clear. The visualized skeletal structures are unremarkable.  IMPRESSION: No active cardiopulmonary disease.   Electronically Signed   By: Lucienne Capers M.D.   On: 11/08/2014 04:02   Ct Head Wo Contrast  11/08/2014   CLINICAL DATA:  Syncopal episodes for a month and a half. Has seen primary care for same without diagnosis. Passed out twice today. Dizziness and lightheadedness. Unsteady on feet.  EXAM: CT HEAD WITHOUT CONTRAST  TECHNIQUE: Contiguous axial images were obtained from the base of the skull through the vertex without intravenous contrast.  COMPARISON:  07/28/2011  FINDINGS: Mild cerebral atrophy. No ventricular dilatation. No mass effect or midline shift. No abnormal extra-axial fluid collections. Gray-white matter junctions are distinct. Basal cisterns are not effaced. No  evidence of acute intracranial hemorrhage. No depressed skull fractures. Visualized paranasal sinuses and mastoid air cells are not opacified.  IMPRESSION: No acute intracranial abnormalities.  Mild atrophy.   Electronically Signed   By: Lucienne Capers M.D.   On: 11/08/2014 04:01   ekg reviewed nsr cxr reviewed normal Old records reviewed Case discussed with dr Roxanne Mins edp  Assessment/Plan  49 yo female with several syncopal episodes in the last month  Principal Problem:   Syncope and collapse-  ekg normal.  Is orthostatic but episodes do not always occur with change in position.  Check cardiac echo in am.  Check tsh.  clarify home medications.  Is  having outpt w/u for weight loss of 30 lbs including GI work up.  Consider holter monitor or [facility] set up at discharge to evaluate for significant arrythmias.  Order eeg in am.  Has h/o copd in her chart, will also ambulate for ten minutes and measure her oxygen sats, she may be having episodes of hypoxia causing her syncope.  There are no focal neuro deficits, so will not pursue mri brain.  She probably should not be driving until this is cleared up.  Active Problems:   Hyponatremia-  Order osm studies, spot na check.     Bipolar 1 disorder- noted   Hypertension- noted   Unintentional weight loss- noted, pt reports she has had recent scoping with GI, but none since feb, per GI notes, she has been refusing further w/u and follow up her PUD/erosive esophagitis.  Lytes and alb are normal.  Cont f/u with PCP.     Peptic ulcer disease- noted   Orthostatic hypotension- noted  obs on tele .  Full code.  Izzabelle Bouley A 11/08/2014, 4:27 AM

## 2014-11-09 ENCOUNTER — Inpatient Hospital Stay (HOSPITAL_COMMUNITY): Payer: 59

## 2014-11-09 ENCOUNTER — Other Ambulatory Visit (HOSPITAL_COMMUNITY): Payer: 59

## 2014-11-09 ENCOUNTER — Other Ambulatory Visit: Payer: Self-pay | Admitting: Adult Health

## 2014-11-09 DIAGNOSIS — R55 Syncope and collapse: Secondary | ICD-10-CM

## 2014-11-09 LAB — BASIC METABOLIC PANEL
Anion gap: 8 (ref 5–15)
BUN: 9 mg/dL (ref 6–20)
CO2: 23 mmol/L (ref 22–32)
CREATININE: 0.73 mg/dL (ref 0.44–1.00)
Calcium: 8.7 mg/dL — ABNORMAL LOW (ref 8.9–10.3)
Chloride: 101 mmol/L (ref 101–111)
GFR calc Af Amer: >60 mL/min (ref >60)
GFR calc non Af Amer: >60 mL/min (ref >60)
GLUCOSE: 87 mg/dL (ref 65–99)
Potassium: 3.4 mmol/L — ABNORMAL LOW (ref 3.5–5.1)
Sodium: 132 mmol/L — ABNORMAL LOW (ref 135–145)

## 2014-11-09 LAB — GLUCOSE, CAPILLARY: Glucose-Capillary: 72 mg/dL (ref 65–99)

## 2014-11-09 LAB — HIV ANTIBODY (ROUTINE TESTING W REFLEX): HIV Screen 4th Generation wRfx: NONREACTIVE

## 2014-11-09 LAB — TROPONIN I: Troponin I: <0.03 ng/mL (ref <0.031)

## 2014-11-09 MED ORDER — PINDOLOL 5 MG PO TABS
5.0000 mg | ORAL_TABLET | Freq: Two times a day (BID) | ORAL | Status: DC
  Administered 2014-11-09 – 2014-11-10 (×2): 5 mg via ORAL
  Filled 2014-11-09 (×6): qty 1

## 2014-11-09 NOTE — Progress Notes (Signed)
  Echocardiogram 2D Echocardiogram has been performed.  Darlina Sicilian M 11/09/2014, 9:21 AM

## 2014-11-09 NOTE — Progress Notes (Signed)
CNA reported that patient refused to let her put bed alarm on.  She is a high fall risk and bed alarm is mandatory.

## 2014-11-09 NOTE — Progress Notes (Addendum)
[facility] PROGRESS NOTE  BROCK MOKRY CVE:[id number] DOB: 25-Aug-1965 DOA: 11/08/2014 PCP: Purvis Kilts, MD  Assessment/Plan: 1. Syncope. Etiology is unclear. Seen by cardiology and plans are for her event monitor. Telemetry has been unremarkable. She does have mild orthostasis. Other workup has been unremarkable. EEG done with report pending. 2. Hyponatremia. Likely related to increased free water intake. TSH and cortisol normal. Urine osmolality and serum osmolality low. She admits to drinking 140-160 ounces of water daily. She was given IV saline and her serum sodium has improved.  3. Hypertension. Started on pindolol per cardiology. 4. Bipolar disorder. Continue current meds. 5. Peptic ulcer disease. Continue proton pump inhibitors. 6. Unintentional weight loss. HIV negative, TSH and cortisol normal. Continue further workup as an outpatient.  Code Status: full code Family Communication: discussed with patient Disposition Plan: discharge home, likely in am   Consultants:  Cardiology  Procedures:  Echo: - Left ventricle: The cavity size was normal. Wall thickness was normal. Systolic function was normal. The estimated ejection fraction was in the range of 60% to 65%. - Aortic valve: There was mild regurgitation. - Pulmonary arteries: PA peak pressure: 31 mm Hg (S).  Antibiotics:    HPI/Subjective: Patient is not having any chest pain, shortness of breath or dizziness  Objective: Filed Vitals:   11/09/14 0948  BP:   Pulse: 99  Temp: 98.7 F (37.1 C)  Resp: 20    Intake/Output Summary (Last 24 hours) at 11/09/14 1751 Last data filed at 11/09/14 1516  Gross per 24 hour  Intake 2305.42 ml  Output   4600 ml  Net -2294.58 ml   Filed Weights   11/08/14 0252 11/08/14 0630 11/09/14 0539  Weight: 48.081 kg (106 lb) 47.038 kg (103 lb 11.2 oz) 48.308 kg (106 lb 8 oz)    Exam:   General:  NAD  Cardiovascular: S1, S2 RRR  Respiratory: CTA  B  Abdomen: soft, nt, nd, bs+  Musculoskeletal: no edema b/l   Data Reviewed: Basic Metabolic Panel:  Recent Labs Lab 11/08/14 0305 11/08/14 0555 11/09/14 0623  NA 125* 128* 132*  K 3.8 3.5 3.4*  CL 88* 93* 101  CO2 24 26 23   GLUCOSE 92 96 87  BUN 15 13 9   CREATININE 0.91 0.81 0.73  CALCIUM 9.3 8.7* 8.7*   Liver Function Tests:  Recent Labs Lab 11/08/14 0305 11/08/14 0555  AST 41 34  ALT 20 17  ALKPHOS 56 50  BILITOT 0.8 0.8  PROT 7.2 6.6  ALBUMIN 4.0 3.5   No results for input(s): LIPASE, AMYLASE in the last 168 hours. No results for input(s): AMMONIA in the last 168 hours. CBC:  Recent Labs Lab 11/08/14 0305 11/08/14 0555  WBC 9.4 8.1  NEUTROABS 6.9  --   HGB 13.3 12.8  HCT 37.4 36.8  MCV 88.8 90.2  PLT 302 304   Cardiac Enzymes:  Recent Labs Lab 11/08/14 0305 11/08/14 1717 11/08/14 2238 11/09/14 0623  TROPONINI <0.03 <0.03 <0.03 <0.03   BNP (last 3 results) No results for input(s): BNP in the last 8760 hours.  ProBNP (last 3 results) No results for input(s): PROBNP in the last 8760 hours.  CBG:  Recent Labs Lab 11/08/14 0757 11/09/14 0755  GLUCAP 95 72    No results found for this or any previous visit (from the past 240 hour(s)).   Studies: Dg Chest 2 View  11/08/2014   CLINICAL DATA:  Syncopal episodes for 1.5 months.  EXAM: CHEST  2 VIEW  COMPARISON:  CT chest 05/14/2014  FINDINGS: Hyperinflation. The heart size and mediastinal contours are within normal limits. Both lungs are clear. The visualized skeletal structures are unremarkable.  IMPRESSION: No active cardiopulmonary disease.   Electronically Signed   By: Lucienne Capers M.D.   On: 11/08/2014 04:02   Ct Head Wo Contrast  11/08/2014   CLINICAL DATA:  Syncopal episodes for a month and a half. Has seen primary care for same without diagnosis. Passed out twice today. Dizziness and lightheadedness. Unsteady on feet.  EXAM: CT HEAD WITHOUT CONTRAST  TECHNIQUE: Contiguous axial  images were obtained from the base of the skull through the vertex without intravenous contrast.  COMPARISON:  07/28/2011  FINDINGS: Mild cerebral atrophy. No ventricular dilatation. No mass effect or midline shift. No abnormal extra-axial fluid collections. Gray-white matter junctions are distinct. Basal cisterns are not effaced. No evidence of acute intracranial hemorrhage. No depressed skull fractures. Visualized paranasal sinuses and mastoid air cells are not opacified.  IMPRESSION: No acute intracranial abnormalities.  Mild atrophy.   Electronically Signed   By: Lucienne Capers M.D.   On: 11/08/2014 04:01    Scheduled Meds: . budesonide  0.25 mg Nebulization BID  . lamoTRIgine  300 mg Oral Daily  . pantoprazole  40 mg Oral BID  . pindolol  5 mg Oral BID  . sodium chloride  1,000 mL Intravenous Once  . sodium chloride  3 mL Intravenous Q12H  . ziprasidone  60 mg Oral Daily   Continuous Infusions:   Principal Problem:   Syncope and collapse Active Problems:   Hyponatremia   Bipolar 1 disorder   Hypertension   Unintentional weight loss   Peptic ulcer disease   Orthostatic hypotension   Syncope    Time spent: 73mins    Nicholad Kautzman  [facility] Pager [phone]. If 7PM-7AM, please contact night-coverage at [email or web address], password [facility] 11/09/2014, 5:51 PM  LOS: 1 day

## 2014-11-09 NOTE — Consult Note (Addendum)
CARDIOLOGY CONSULT NOTE   Patient ID: LINDAANN GRADILLA MRN: [id number] DOB/AGE: [birth year] 49 y.o.  Admit Date: 11/08/2014 Referring Physician: PTH Primary Physician: Purvis Kilts, MD Consulting Cardiologist: Dorris Carnes Primary Cardiologist: Quay Burow MD Reason for Consultation: Syncope  HPI  Patient is a 49 yo with a history of syncople for the past 5 to 6 years.  Patient admitted once in 2013  This is the second hospitalization.  Patient has had episodes of syncpe in between  Refuses to cime in  Last 2 wks of May passed out 7 times   Went to see Dr Hilma Favors on June 7  He said to stay out of work for 1 wk  Pt says she was at home  Most of time felt OK  Getting around  [place].  Up/down stairs No problm  Only one episode of lightheadedness  She sat down Night of admission  Took shower around 8:15 PM  Felt fine  Not a long shower  Got dressed  Fixed hair  Brushed teeth  Felt OK Walked in hall to living room  Passed out  NO PRODROME  Not sure how long  On floor.   Crawled to living room  Got to chair  Got in  Put head on rest  Tried getting up several times but dizzy.  Sat back.  Drank about 100 cc fluid.  Eventually got up  Went to car  At car dizzinss worse  Went back towards house  Passed out inside house.  When came to crawled to chair and called friend  Came to ER. The syncopal spells she had had in past MOST are with no warning, some are.  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    On arrival to ER,  BP 155/87, HR 79, O2 Sat 99%. Decreased urine osmolality at 85, lactic acid 2.16, Na+ 125, Cl 88. CXR no active disease. She was not found to be anemic. CT negative for acute intracranial abnormalities.  EKG NSR rate of 88 bpm. Troponin negative X 3. Echo has been ordered. She states she is not on any antihypertensive medications   Placed on prednisone by primary per patient request to boost appetite  Wt is low  She wants to gain  Did not help  much.   Drinks 5-7 20 ounze bottles fluid at work  HOt at work   [place]  . Codeine Hives and Rash    Not in right state of mind    Medications Scheduled Medications: . budesonide  0.25 mg Nebulization BID  . lamoTRIgine  300 mg Oral Daily  . pantoprazole  40 mg Oral BID  . sodium chloride  1,000 mL Intravenous Once  . sodium chloride  3 mL Intravenous Q12H  . ziprasidone  60 mg Oral Daily    Infusions: . sodium chloride 1,000 mL (11/09/14 0140)     Past Medical History  Diagnosis Date  . Hypertension   . Asthma   . Mental disorder     bipolar, sees Dr Wylene Simmer  . COPD (chronic obstructive pulmonary disease)     Past Surgical History  Procedure Laterality Date  . Abdominal hysterectomy    . Colonoscopy      remote past in [place].   . Colonoscopy N/A 07/20/2014    RMR: Colonic diverticulosis. Single colonic polyp removed as described above  . Esophagogastroduodenoscopy N/A 07/20/2014    RMR: Erosive reflux esophagitis. Hiatal  hernia. Multiple gastric ulcers and erosinons status post biopsy.    Family History  Problem Relation Age of Onset  . Cancer Father     lung  . Cancer Mother     lymphoma  . Stroke Mother   . Hypertension Mother   . Heart murmur Mother   . Cancer Maternal Grandfather     prostate  . Colon cancer Neg Hx     Social History Ms. Leikam reports that she has been smoking Cigarettes.  She has a 14.5 pack-year smoking history. She has never used smokeless tobacco. Ms. Padin reports that she drinks alcohol.  Review of Systems Complete review of systems are found to be negative unless outlined in H&P above.  Physical Examination Blood pressure 164/79, pulse 69, temperature 98.5 F (36.9 C), temperature source Oral, resp. rate 20, height 5\' 6"  (1.676 m), weight 106 lb 8 oz (48.308 kg), SpO2 100 %.  Intake/Output Summary (Last 24 hours) at 11/09/14 0910 Last data filed at 11/09/14 0700  Gross per 24 hour  Intake  1705.42 ml  Output   3800 ml  Net -2094.58 ml    Telemetry: NSR. No evidence of pauses, arrhythmias or bradycardia.   GEN:No acute distress  HEENT: Conjunctiva and lids normal, oropharynx clear with moist mucosa. Neck: Supple, no elevated JVP or carotid bruits, no thyromegaly. Lungs: Clear to auscultation, nonlabored breathing at rest. Cardiac: Regular rate and rhythm, no S3 or significant systolic murmur, no pericardial rub. Abdomen: Soft, nontender, no hepatomegaly, bowel sounds present, no guarding or rebound. Extremities: No pitting edema, distal pulses 2+. Skin: Warm and dry. Musculoskeletal: No kyphosis. Neuropsychiatric: Alert and oriented x3, affect grossly appropriate.  Prior CardiaLab Results  Basic Metabolic Panel:  Recent Labs Lab 11/08/14 0305 11/08/14 0555 11/09/14 0623  NA 125* 128* 132*  K 3.8 3.5 3.4*  CL 88* 93* 101  CO2 24 26 23   GLUCOSE 92 96 87  BUN 15 13 9   CREATININE 0.91 0.81 0.73  CALCIUM 9.3 8.7* 8.7*    Liver Function Tests:  Recent Labs Lab 11/08/14 0305 11/08/14 0555  AST 41 34  ALT 20 17  ALKPHOS 56 50  BILITOT 0.8 0.8  PROT 7.2 6.6  ALBUMIN 4.0 3.5    CBC:  Recent Labs Lab 11/08/14 0305 11/08/14 0555  WBC 9.4 8.1  NEUTROABS 6.9  --   HGB 13.3 12.8  HCT 37.4 36.8  MCV 88.8 90.2  PLT 302 304    Cardiac Enzymes:  Recent Labs Lab 11/08/14 0305 11/08/14 1717 11/08/14 2238 11/09/14 0623  TROPONINI <0.03 <0.03 <0.03 <0.03    Radiology: Dg Chest 2 View  11/08/2014   CLINICAL DATA:  Syncopal episodes for 1.5 months.  EXAM: CHEST  2 VIEW  COMPARISON:  CT chest 05/14/2014  FINDINGS: Hyperinflation. The heart size and mediastinal contours are within normal limits. Both lungs are clear. The visualized skeletal structures are unremarkable.  IMPRESSION: No active cardiopulmonary disease.   Electronically Signed   By: Lucienne Capers M.D.   On: 11/08/2014 04:02   Ct Head Wo Contrast  11/08/2014   CLINICAL DATA:  Syncopal  episodes for a month and a half. Has seen primary care for same without diagnosis. Passed out twice today. Dizziness and lightheadedness. Unsteady on feet.  EXAM: CT HEAD WITHOUT CONTRAST  TECHNIQUE: Contiguous axial images were obtained from the base of the skull through the vertex without intravenous contrast.  COMPARISON:  07/28/2011  FINDINGS: Mild cerebral atrophy. No ventricular dilatation. No mass  effect or midline shift. No abnormal extra-axial fluid collections. Gray-white matter junctions are distinct. Basal cisterns are not effaced. No evidence of acute intracranial hemorrhage. No depressed skull fractures. Visualized paranasal sinuses and mastoid air cells are not opacified.  IMPRESSION: No acute intracranial abnormalities.  Mild atrophy.   Electronically Signed   By: Lucienne Capers M.D.   On: 11/08/2014 04:01    ECG: NSR rate of 88 bpm. QTc 480   Impression and Recommendations  1.Syncope.  Patient has had a long history of syncope  None of spells have been observed  She has 2 types of spells  Most she says have no warning  Others associated wit hdizzinessn prior.  On night of admission she had both types.  Again, unwitnessed. On eval, pt does have a drop in BP with standing  Consistent with orthostasis.  Note that she is hypertensive at baseline.   I have asked nursing to repeat with additional standing measurement at 3 min.  I have reviewed echo  It is normal.   2. Hyponatremia : Na low on admit  Has received IV fluids Imprved  Encouraged her to drink all types of fluids not just water  3. Hypertension: BP is high lying down  Needs to elevate head of bed.  Would place on pindolol 5 mg bid   4. BiPolar Disorder  Continue meds   Signed: Phill Myron. Lawrence NP [place]  11/09/2014, 9:10 AM Co-Sign MD  Patinet seen and examined.  I have amended note above to reflect my findings.  Patient with many unwitnessed syncopal spells  No witnessed spells  Two distinct types by history .  See  above  On exam, patient is orthostatic this afternoon  BP 170 lying Decreases to 149  HR increases from 87 to 113. Would try nonselective b blocker pindolol 5 bid to regmin   Elevate Head of bed at night   I have encouraged increased fluid intake.   Would like to initiate meds prior to d/c.  Can be followed as outpatient   I would set up for an event monitor  Will start arrangements  QT is prolonged though not significantly  Unlikely to be related to syncope since so longstanding.    Will have her f/u with me.  Patient counselled that she should not drive for 6 months from time of last episode syncope    Dorris Carnes  11/09/2014

## 2014-11-10 LAB — GLUCOSE, CAPILLARY: Glucose-Capillary: 103 mg/dL — ABNORMAL HIGH (ref 65–99)

## 2014-11-10 MED ORDER — PINDOLOL 5 MG PO TABS: 5.0000 mg | ORAL_TABLET | Freq: Two times a day (BID) | ORAL | Status: DC

## 2014-11-10 NOTE — Progress Notes (Signed)
Patient states understanding of discharge, prescriptions given 

## 2014-11-10 NOTE — Discharge Summary (Signed)
Physician Discharge Summary  Bethany Espinoza DJM:[id number] DOB: 1966/05/05 DOA: 11/08/2014  PCP: Purvis Kilts, MD  Admit date: 11/08/2014 Discharge date: 11/10/2014  Time spent: 40 minutes  Recommendations for Outpatient Follow-up:  1. Follow up with primary care physician in 1-2 weeks 2. Follow up with cardiology on 6/20 for further work up of syncope and event monitor  Discharge Diagnoses:  Principal Problem:   Syncope and collapse Active Problems:   Hyponatremia   Bipolar 1 disorder   Hypertension   Unintentional weight loss   Peptic ulcer disease   Orthostatic hypotension   Syncope   Discharge Condition: improved  Diet recommendation: low salt  Filed Weights   11/08/14 0630 11/09/14 0539 11/10/14 0655  Weight: 47.038 kg (103 lb 11.2 oz) 48.308 kg (106 lb 8 oz) 48.217 kg (106 lb 4.8 oz)    History of present illness:  This patient was admitted to the hospital with repeated episodes of syncope. She describes these episodes occurring over the past 4-6 weeks and were unwitnessed. She appeared to have 2 different kinds of episodes, one related to lightheadedness, and the other one did not have any prodrome.  Hospital Course:  Patient was admitted to the hospital and monitored on telemetry. She did not have any evidence of arrhythmias. TSH and cortisol checked and found to be in normal range. Echocardiogram was done which was found to be unremarkable. EEG with time with report currently pending. Although, seizure in this clinical scenario would be unlikely. She was seen by cardiology who recommended possible event monitoring. She will follow-up with cardiology clinic to obtain a monitor. She was started on pindolol by Dr. Harrington Challenger and will follow-up for further titration of her medications. She was noted to be significantly hyponatremic on admission reported drinking 140-160 ounces of water a day. She's been advised to drink a variety of fluids including sports drinks instead  of just water and to limit her fluid intake. Patient is feeling significantly improved at this time. She no longer had any episodes of syncope in the hospital. She has been adequately monitored in the hospital and can be discharged home for outpatient follow-up.  Procedures: Echo:  - Left ventricle: The cavity size was normal. Wall thickness was normal. Systolic function was normal. The estimated ejection fraction was in the range of 60% to 65%. - Aortic valve: There was mild regurgitation. - Pulmonary arteries: PA peak pressure: 31 mm Hg (S).  Consultations:  Cardiology  Discharge Exam: Filed Vitals:   11/10/14 0655  BP:   Pulse: 66  Temp: 98.9 F (37.2 C)  Resp: 20    General: NAD Cardiovascular: S1, S2 RR Respiratory: CTA B  Discharge Instructions   Discharge Instructions    Diet - low sodium heart healthy    Complete by:  As directed      Increase activity slowly    Complete by:  As directed           Discharge Medication List as of 11/10/2014 10:48 AM    START taking these medications   Details  pindolol (VISKEN) 5 MG tablet Take 1 tablet (5 mg total) by mouth 2 (two) times daily., Starting 11/10/2014, Until Discontinued, Normal      CONTINUE these medications which have NOT CHANGED   Details  albuterol (PROVENTIL HFA;VENTOLIN HFA) 108 (90 BASE) MCG/ACT inhaler Inhale 2 puffs into the lungs every 6 (six) hours as needed. Shortness of breath, Until Discontinued, Historical Med    fluticasone (FLOVENT DISKUS) 50  MCG/BLIST diskus inhaler Inhale 1 puff into the lungs 2 (two) times daily. As needed, Until Discontinued, Historical Med    lamoTRIgine (LAMICTAL) 150 MG tablet Take 300 mg by mouth daily., Until Discontinued, Historical Med    pantoprazole (PROTONIX) 40 MG tablet Take 40 mg by mouth 2 (two) times daily., Until Discontinued, Historical Med    predniSONE (DELTASONE) 10 MG tablet Take 10 mg by mouth 4 (four) times daily., Until Discontinued,  Historical Med    Vitamin D, Ergocalciferol, (DRISDOL) 50000 UNITS CAPS capsule Take 50,000 Units by mouth every 7 (seven) days. Take on Monday, Until Discontinued, Historical Med    ziprasidone (GEODON) 60 MG capsule Take 60 mg by mouth daily., Until Discontinued, Historical Med       Allergies  Allergen Reactions  . Codeine Hives and Rash    Not in right state of mind   Follow-up Information    Follow up On 11/12/2014.   Why:  appt with cardiology on monday june 20,2016 at 4:00      Follow up with Purvis Kilts, MD. Schedule an appointment as soon as possible for a visit in 2 weeks.   Specialty:  Family Medicine   Contact information:   [address] [place] [address] [phone]        The results of significant diagnostics from this hospitalization (including imaging, microbiology, ancillary and laboratory) are listed below for reference.    Significant Diagnostic Studies: Dg Chest 2 View  11/08/2014   CLINICAL DATA:  Syncopal episodes for 1.5 months.  EXAM: CHEST  2 VIEW  COMPARISON:  CT chest 05/14/2014  FINDINGS: Hyperinflation. The heart size and mediastinal contours are within normal limits. Both lungs are clear. The visualized skeletal structures are unremarkable.  IMPRESSION: No active cardiopulmonary disease.   Electronically Signed   By: Lucienne Capers M.D.   On: 11/08/2014 04:02   Ct Head Wo Contrast  11/08/2014   CLINICAL DATA:  Syncopal episodes for a month and a half. Has seen primary care for same without diagnosis. Passed out twice today. Dizziness and lightheadedness. Unsteady on feet.  EXAM: CT HEAD WITHOUT CONTRAST  TECHNIQUE: Contiguous axial images were obtained from the base of the skull through the vertex without intravenous contrast.  COMPARISON:  07/28/2011  FINDINGS: Mild cerebral atrophy. No ventricular dilatation. No mass effect or midline shift. No abnormal extra-axial fluid collections. Gray-white matter junctions are distinct.  Basal cisterns are not effaced. No evidence of acute intracranial hemorrhage. No depressed skull fractures. Visualized paranasal sinuses and mastoid air cells are not opacified.  IMPRESSION: No acute intracranial abnormalities.  Mild atrophy.   Electronically Signed   By: Lucienne Capers M.D.   On: 11/08/2014 04:01    Microbiology: No results found for this or any previous visit (from the past 240 hour(s)).   Labs: Basic Metabolic Panel:  Recent Labs Lab 11/08/14 0305 11/08/14 0555 11/09/14 0623  NA 125* 128* 132*  K 3.8 3.5 3.4*  CL 88* 93* 101  CO2 24 26 23   GLUCOSE 92 96 87  BUN 15 13 9   CREATININE 0.91 0.81 0.73  CALCIUM 9.3 8.7* 8.7*   Liver Function Tests:  Recent Labs Lab 11/08/14 0305 11/08/14 0555  AST 41 34  ALT 20 17  ALKPHOS 56 50  BILITOT 0.8 0.8  PROT 7.2 6.6  ALBUMIN 4.0 3.5   No results for input(s): LIPASE, AMYLASE in the last 168 hours. No results for input(s): AMMONIA in the last 168 hours.  CBC:  Recent Labs Lab 11/08/14 0305 11/08/14 0555  WBC 9.4 8.1  NEUTROABS 6.9  --   HGB 13.3 12.8  HCT 37.4 36.8  MCV 88.8 90.2  PLT 302 304   Cardiac Enzymes:  Recent Labs Lab 11/08/14 0305 11/08/14 1717 11/08/14 2238 11/09/14 0623  TROPONINI <0.03 <0.03 <0.03 <0.03   BNP: BNP (last 3 results) No results for input(s): BNP in the last 8760 hours.  ProBNP (last 3 results) No results for input(s): PROBNP in the last 8760 hours.  CBG:  Recent Labs Lab 11/08/14 0757 11/09/14 0755 11/10/14 0527  GLUCAP 95 72 103*       Signed:  MEMON,JEHANZEB  [facility] 11/10/2014, 3:44 PM

## 2014-11-11 NOTE — Procedures (Signed)
  [place] A. Merlene Laughter, MD     [email or web address]           HISTORY: The patient is a 49 year old female who presents with recurrent episodes of loss of consciousness and syncope suspicious for seizures. These events have been unwitnessed.   MEDICATIONS: Scheduled Meds: Continuous Infusions: PRN Meds:.  Prior to Admission medications   Medication Sig Start Date End Date Taking? Authorizing Provider  albuterol (PROVENTIL HFA;VENTOLIN HFA) 108 (90 BASE) MCG/ACT inhaler Inhale 2 puffs into the lungs every 6 (six) hours as needed. Shortness of breath    Historical Provider, MD  fluticasone (FLOVENT DISKUS) 50 MCG/BLIST diskus inhaler Inhale 1 puff into the lungs 2 (two) times daily. As needed    Historical Provider, MD  lamoTRIgine (LAMICTAL) 150 MG tablet Take 300 mg by mouth daily.    Historical Provider, MD  pantoprazole (PROTONIX) 40 MG tablet Take 40 mg by mouth 2 (two) times daily.    Historical Provider, MD  pindolol (VISKEN) 5 MG tablet Take 1 tablet (5 mg total) by mouth 2 (two) times daily. 11/10/14   Kathie Dike, MD  predniSONE (DELTASONE) 10 MG tablet Take 10 mg by mouth 4 (four) times daily.    Historical Provider, MD  Vitamin D, Ergocalciferol, (DRISDOL) 50000 UNITS CAPS capsule Take 50,000 Units by mouth every 7 (seven) days. Take on Monday    Historical Provider, MD  ziprasidone (GEODON) 60 MG capsule Take 60 mg by mouth daily.    Historical Provider, MD      ANALYSIS: A 16 channel recording using standard 10 20 measurements is conducted for 22 minutes. There is a well-formed posterior dominant rhythm of 9 Hz which attenuates with eye opening. There is beta activity observed in the frontal areas. Awake and drowsy activities are recorded. Photic stimulation is carried out without abnormal changes in the background activity. There is no focal or lateralized slowing. There is no epileptiform activity observed. There is significant myogenic and movement  artifacts seen throughout the recording.   IMPRESSION: This is a normal recording of awake and drowsy states.      Dontario Evetts A. Merlene Laughter, M.D.  Diplomate, Tax adviser of Psychiatry and Neurology ( Neurology).

## 2014-11-12 ENCOUNTER — Other Ambulatory Visit: Payer: Self-pay

## 2014-11-12 ENCOUNTER — Other Ambulatory Visit (HOSPITAL_COMMUNITY): Payer: Self-pay | Admitting: Family Medicine

## 2014-11-12 DIAGNOSIS — R55 Syncope and collapse: Secondary | ICD-10-CM

## 2014-11-15 ENCOUNTER — Ambulatory Visit (INDEPENDENT_AMBULATORY_CARE_PROVIDER_SITE_OTHER): Payer: 59

## 2014-11-15 DIAGNOSIS — R55 Syncope and collapse: Secondary | ICD-10-CM

## 2014-11-19 ENCOUNTER — Telehealth: Payer: Self-pay | Admitting: Gastroenterology

## 2014-11-19 NOTE — Telephone Encounter (Signed)
Following up on labs we requested at time of last OV. TSH normal. AM fasting cortisol unremarkable.  Noted patient admitted recently with recurrent syncope. Noted to have significant hyponatremia. Weight down additional five pounds.   Wt Readings from Last 3 Encounters:  11/10/14 106 lb 4.8 oz (48.217 kg)  10/19/14 111 lb 9.6 oz (50.621 kg)  07/20/14 123 lb (55.792 kg)    Patient still should reconsider repeat EGD to verify ulcer healing and if negative, then CT A/P for unexplained weight loss.  Please offer OV if she would like to pursue the above.

## 2014-11-21 NOTE — Telephone Encounter (Signed)
Tried to call pt- NA 

## 2014-11-22 ENCOUNTER — Encounter: Payer: 59 | Admitting: Internal Medicine

## 2014-11-22 ENCOUNTER — Other Ambulatory Visit (HOSPITAL_COMMUNITY): Payer: 59

## 2014-11-28 ENCOUNTER — Telehealth: Payer: Self-pay | Admitting: *Deleted

## 2014-11-28 NOTE — Telephone Encounter (Signed)
Tried to call pt- NA 

## 2014-11-28 NOTE — Telephone Encounter (Signed)
[facility] called a serious report of Sinus Tachycardia with PVC's/ Bigeminal PVC's (54) . Dr. Harrington Challenger notified via phone and report emailed.

## 2014-11-29 NOTE — Telephone Encounter (Signed)
I requested for PCP to fax [place] labs from the past 3 months

## 2014-11-29 NOTE — Telephone Encounter (Signed)
Letter mailed to the pt.  Routing to [facility] for an [employer]

## 2014-11-30 ENCOUNTER — Encounter: Payer: Self-pay | Admitting: Gastroenterology

## 2014-12-27 NOTE — Progress Notes (Signed)
Error   This encounter was created in error - please disregard. 

## 2014-12-28 ENCOUNTER — Encounter: Payer: 59 | Admitting: Internal Medicine

## 2015-01-02 ENCOUNTER — Encounter: Payer: Self-pay | Admitting: Internal Medicine

## 2015-01-09 ENCOUNTER — Ambulatory Visit: Payer: 59 | Admitting: Neurology

## 2015-01-31 ENCOUNTER — Encounter: Payer: 59 | Admitting: Internal Medicine

## 2015-02-14 ENCOUNTER — Other Ambulatory Visit (HOSPITAL_COMMUNITY): Payer: Self-pay | Admitting: Family Medicine

## 2015-02-14 ENCOUNTER — Ambulatory Visit (HOSPITAL_COMMUNITY)
Admission: RE | Admit: 2015-02-14 | Discharge: 2015-02-14 | Disposition: A | Discharge status: Home / Self Care | Payer: 59 | Source: Ambulatory Visit | Attending: Family Medicine | Admitting: Family Medicine

## 2015-02-14 DIAGNOSIS — Z681 Body mass index (BMI) 19 or less, adult: Secondary | ICD-10-CM | POA: Diagnosis present

## 2015-02-14 DIAGNOSIS — Z1389 Encounter for screening for other disorder: Secondary | ICD-10-CM | POA: Diagnosis present

## 2015-02-14 DIAGNOSIS — J209 Acute bronchitis, unspecified: Secondary | ICD-10-CM | POA: Insufficient documentation

## 2015-02-19 ENCOUNTER — Emergency Department (HOSPITAL_COMMUNITY)
Admission: EM | Admit: 2015-02-19 | Discharge: 2015-02-19 | Disposition: A | Discharge status: Home / Self Care | Payer: 59 | Attending: Emergency Medicine | Admitting: Emergency Medicine

## 2015-02-19 ENCOUNTER — Encounter (HOSPITAL_COMMUNITY): Payer: Self-pay | Admitting: Emergency Medicine

## 2015-02-19 ENCOUNTER — Emergency Department (HOSPITAL_COMMUNITY): Payer: 59

## 2015-02-19 DIAGNOSIS — I10 Essential (primary) hypertension: Secondary | ICD-10-CM | POA: Insufficient documentation

## 2015-02-19 DIAGNOSIS — Z79899 Other long term (current) drug therapy: Secondary | ICD-10-CM | POA: Diagnosis not present

## 2015-02-19 DIAGNOSIS — R0981 Nasal congestion: Secondary | ICD-10-CM

## 2015-02-19 DIAGNOSIS — F319 Bipolar disorder, unspecified: Secondary | ICD-10-CM | POA: Insufficient documentation

## 2015-02-19 DIAGNOSIS — J449 Chronic obstructive pulmonary disease, unspecified: Secondary | ICD-10-CM | POA: Diagnosis not present

## 2015-02-19 DIAGNOSIS — Z720 Tobacco use: Secondary | ICD-10-CM | POA: Insufficient documentation

## 2015-02-19 LAB — CBC WITH DIFFERENTIAL/PLATELET
BASOS ABS: 0 10*3/uL (ref 0.0–0.1)
BASOS PCT: 1 %
EOS PCT: 1 %
Eosinophils Absolute: 0 10*3/uL (ref 0.0–0.7)
HCT: 40.6 % (ref 36.0–46.0)
Hemoglobin: 14.3 g/dL (ref 12.0–15.0)
Lymphocytes Relative: 32 %
Lymphs Abs: 1.8 10*3/uL (ref 0.7–4.0)
MCH: 32.9 pg (ref 26.0–34.0)
MCHC: 35.2 g/dL (ref 30.0–36.0)
MCV: 93.3 fL (ref 78.0–100.0)
MONO ABS: 0.5 10*3/uL (ref 0.1–1.0)
MONOS PCT: 9 %
NEUTROS ABS: 3.3 10*3/uL (ref 1.7–7.7)
Neutrophils Relative %: 57 %
PLATELETS: 372 10*3/uL (ref 150–400)
RBC: 4.35 MIL/uL (ref 3.87–5.11)
RDW: 12.3 % (ref 11.5–15.5)
WBC: 5.7 10*3/uL (ref 4.0–10.5)

## 2015-02-19 LAB — BASIC METABOLIC PANEL
ANION GAP: 12 (ref 5–15)
BUN: 9 mg/dL (ref 6–20)
CALCIUM: 9.1 mg/dL (ref 8.9–10.3)
CO2: 24 mmol/L (ref 22–32)
CREATININE: 0.88 mg/dL (ref 0.44–1.00)
Chloride: 100 mmol/L — ABNORMAL LOW (ref 101–111)
GFR calc Af Amer: >60 mL/min (ref >60)
GLUCOSE: 80 mg/dL (ref 65–99)
Potassium: 3.8 mmol/L (ref 3.5–5.1)
Sodium: 136 mmol/L (ref 135–145)

## 2015-02-19 LAB — TROPONIN I: Troponin I: <0.03 ng/mL (ref <0.031)

## 2015-02-19 LAB — TSH: TSH: 3.12 u[IU]/mL (ref 0.350–4.500)

## 2015-02-19 MED ORDER — CLONIDINE HCL 0.1 MG PO TABS
0.1000 mg | ORAL_TABLET | Freq: Once | ORAL | Status: AC
  Administered 2015-02-19: 0.1 mg via ORAL
  Filled 2015-02-19: qty 1

## 2015-02-19 MED ORDER — METOPROLOL TARTRATE 1 MG/ML IV SOLN
2.5000 mg | Freq: Once | INTRAVENOUS | Status: AC
  Administered 2015-02-19: 2.5 mg via INTRAVENOUS
  Filled 2015-02-19: qty 5

## 2015-02-19 MED ORDER — AMLODIPINE BESYLATE 5 MG PO TABS: 5.0000 mg | ORAL_TABLET | Freq: Every day | ORAL | Status: DC

## 2015-02-19 NOTE — Discharge Instructions (Signed)
PLEASE take blood pressure medicines and follow for recheck with your doctor.  If you were given medicines take as directed.  If you are on coumadin or contraceptives realize their levels and effectiveness is altered by many different medicines.  If you have any reaction (rash, tongues swelling, other) to the medicines stop taking and see a physician.    If your blood pressure was elevated in the ER make sure you follow up for management with a primary doctor or return for chest pain, shortness of breath or stroke symptoms.  Please follow up as directed and return to the ER or see a physician for new or worsening symptoms.  Thank you. Filed Vitals:   02/19/15 1030 02/19/15 1100 02/19/15 1130 02/19/15 1201  BP: 192/108 187/101 158/101 161/103  Pulse: 104 84  75  Temp:    97.6 F (36.4 C)  TempSrc:    Oral  Resp: 22 18 14 15   Height:      Weight:      SpO2: 98% 99%  100%

## 2015-02-19 NOTE — ED Notes (Signed)
Having congestion for last 7 days.  Was on antibiotic without relief.  Denies chest pain.  C/o lightheadedness.

## 2015-02-19 NOTE — ED Provider Notes (Signed)
CSN: [id number]     Arrival date & time 02/19/15  6979 History  By signing my name below, I, Terressa Koyanagi, attest that this documentation has been prepared under the direction and in the presence of Elnora Morrison, MD. Electronically Signed: Terressa Koyanagi, ED Scribe. 02/19/2015. 10:31 AM.   Chief Complaint  Patient presents with  . Hypertension  . Nasal Congestion    chest   The history is provided by the patient. No language interpreter was used.   PCP: Purvis Kilts, MD HPI Comments: Bethany Espinoza is a 49 y.o. female, with PMHx noted below including HTN (pt denies BP meds use reporting she was taken off her BP meds by her provider) and daily tobacco use (0.25 ppd), who presents to the Emergency Department complaining of intermittent lightheadedness with associated intermittent elevated BP, weakness 7 days ago. Pt also complains of intermittent cough and congestion onset one week ago. Pt further reports a few episodes of syncope with exertion over the last 6 months-- pt notes she was seen by her PCP for the same. Cardiac workup including an echo was completed in 10/2014 which showed no blockages. Pt denies chest pain, recent surgeries, Hx of blood clots, swelling of LE, loss of vision, slurred speech, weakness of one arm, severe headache, cocaine use, ETOH abuse, or any other Sx at this time.   Past Medical History  Diagnosis Date  . Hypertension   . Asthma   . Mental disorder     bipolar, sees Dr Wylene Simmer  . COPD (chronic obstructive pulmonary disease)    Past Surgical History  Procedure Laterality Date  . Abdominal hysterectomy    . Colonoscopy      remote past in [place].   . Colonoscopy N/A 07/20/2014    RMR: Colonic diverticulosis. Single colonic polyp removed as described above  . Esophagogastroduodenoscopy N/A 07/20/2014    RMR: Erosive reflux esophagitis. Hiatal hernia. Multiple gastric ulcers and erosinons status post biopsy.   Family History  Problem Relation Age of  Onset  . Cancer Father     lung  . Cancer Mother     lymphoma  . Stroke Mother   . Hypertension Mother   . Heart murmur Mother   . Cancer Maternal Grandfather     prostate  . Colon cancer Neg Hx    Social History  Substance Use Topics  . Smoking status: Current Every Day Smoker -- 0.50 packs/day for 29 years    Types: Cigarettes  . Smokeless tobacco: Never Used     Comment: a little over half pack daily  . Alcohol Use: 0.0 oz/week    0 Standard drinks or equivalent per week     Comment: social alcohol   OB History    Gravida Para Term Preterm AB TAB SAB Ectopic Multiple Living   1    1  1         Review of Systems  Constitutional: Positive for diaphoresis. Negative for fever.  HENT: Positive for congestion.   Eyes: Negative for visual disturbance.  Respiratory: Positive for cough. Negative for shortness of breath.   Cardiovascular: Negative for chest pain and leg swelling.  Gastrointestinal: Negative for abdominal pain.  Neurological: Positive for weakness and light-headedness. Negative for headaches.   Allergies  Codeine  Home Medications   Prior to Admission medications   Medication Sig Start Date End Date Taking? Authorizing Provider  lamoTRIgine (LAMICTAL) 150 MG tablet Take 300 mg by mouth daily.   Yes Historical Provider,  MD  levofloxacin (LEVAQUIN) 500 MG tablet Take 500 mg by mouth daily. For 10 days. 02/14/15  Yes Historical Provider, MD  ziprasidone (GEODON) 60 MG capsule Take 60 mg by mouth daily.   Yes Historical Provider, MD  pindolol (VISKEN) 5 MG tablet Take 1 tablet (5 mg total) by mouth 2 (two) times daily. Patient not taking: Reported on 02/19/2015 11/10/14   Kathie Dike, MD   Triage Vitals: BP 181/108 mmHg  Pulse 106  Temp(Src) 98.2 F (36.8 C) (Oral)  Resp 21  Ht 5\' 6"  (1.676 m)  Wt 105 lb (47.628 kg)  BMI 16.96 kg/m2  SpO2 100% Physical Exam  Constitutional: She is oriented to person, place, and time. She appears well-developed and  well-nourished.  HENT:  Head: Normocephalic.  Eyes: EOM are normal.  Neck: Normal range of motion.  Cardiovascular: Normal rate and regular rhythm.   No murmur heard. Pulmonary/Chest: Effort normal and breath sounds normal. No respiratory distress.  Abdominal: Soft. She exhibits no distension. There is no tenderness.  Musculoskeletal: Normal range of motion. She exhibits no edema.  Neurological: She is alert and oriented to person, place, and time.  Psychiatric: She has a normal mood and affect.  Nursing note and vitals reviewed.   ED Course  Procedures (including critical care time) DIAGNOSTIC STUDIES: Oxygen Saturation is 100% on ra, nl by my interpretation.    COORDINATION OF CARE: 10:24 AM: Discussed treatment plan with pt at bedside; patient verbalizes understanding and agrees with treatment plan.  Labs Review Labs Reviewed  BASIC METABOLIC PANEL - Abnormal; Notable for the following:    Chloride 100 (*)    All other components within normal limits  CBC WITH DIFFERENTIAL/PLATELET  TROPONIN I  TSH    Imaging Review No results found. I have personally reviewed and evaluated these images and lab results as part of my medical decision-making.   EKG Interpretation None      MDM   Final diagnoses:  None   Patient presents with uncontrolled high blood pressure, patient has been not taking blood pressure medications as she was told by her doctor to stop per her report. Patient's had congestion as well. No chest pain or shortness of breath, no stroke symptoms, no signs or symptoms of endorgan damage. Patient's blood pressure improved in the ER and she understands importance of follow-up to prevent stroke heart attack etc.  Results and differential diagnosis were discussed with the patient/parent/guardian. Xrays were independently reviewed by myself.  Close follow up outpatient was discussed, comfortable with the plan.   Medications  cloNIDine (CATAPRES) tablet 0.1 mg  (0.1 mg Oral Given 02/19/15 1028)  metoprolol (LOPRESSOR) injection 2.5 mg (2.5 mg Intravenous Given 02/19/15 1028)    Filed Vitals:   02/19/15 1201 02/19/15 1230 02/19/15 1330 02/19/15 1331  BP: 161/103 160/102 160/96 166/113  Pulse: 75 83 86 74  Temp: 97.6 F (36.4 C)   98.6 F (37 C)  TempSrc: Oral   Oral  Resp: 15 19 23 14   Height:      Weight:      SpO2: 100% 100% 99% 100%    Final diagnoses:  Essential hypertension  Congestion of nasal sinus      Elnora Morrison, MD 02/19/15 1547

## 2015-02-26 ENCOUNTER — Other Ambulatory Visit: Payer: Self-pay | Admitting: Adult Health

## 2015-02-27 ENCOUNTER — Encounter: Payer: Self-pay | Admitting: Adult Health

## 2015-02-27 ENCOUNTER — Ambulatory Visit (INDEPENDENT_AMBULATORY_CARE_PROVIDER_SITE_OTHER): Payer: 59 | Admitting: Adult Health

## 2015-02-27 VITALS — BP 132/90 | HR 116 | Ht 65.0 in | Wt 102.5 lb

## 2015-02-27 DIAGNOSIS — Z01419 Encounter for gynecological examination (general) (routine) without abnormal findings: Secondary | ICD-10-CM

## 2015-02-27 DIAGNOSIS — N898 Other specified noninflammatory disorders of vagina: Secondary | ICD-10-CM | POA: Insufficient documentation

## 2015-02-27 DIAGNOSIS — Z1212 Encounter for screening for malignant neoplasm of rectum: Secondary | ICD-10-CM | POA: Diagnosis not present

## 2015-02-27 DIAGNOSIS — R232 Flushing: Secondary | ICD-10-CM | POA: Insufficient documentation

## 2015-02-27 HISTORY — DX: Flushing: R23.2

## 2015-02-27 HISTORY — DX: Other specified noninflammatory disorders of vagina: N89.8

## 2015-02-27 LAB — HEMOCCULT GUIAC POC 1CARD (OFFICE): Fecal Occult Blood, POC: NEGATIVE

## 2015-02-27 NOTE — Progress Notes (Signed)
Patient ID: Bethany Espinoza, female   DOB: [birth year], 49 y.o.   MRN: [id number] History of Present Illness: Bethany Espinoza is a 49 year old black female in for a well woman gyn exam,she is sp hysterectomy.She complains of vaginal dryness and some hot flashes.She has lost over 40 lbs in last 2 years, she says PCP says it is related to her meds.and has had tests done and was negative. PCP Dr Hilma Favors.   Current Medications, Allergies, Past Medical History, Past Surgical History, Family History and Social History were reviewed in [employer] record.     Review of Systems:  Patient denies any headaches, hearing loss, fatigue, blurred vision, shortness of breath, chest pain, abdominal pain, problems with bowel movements, urination, or intercourse(has dryness). No joint pain or mood swings.See HPI for positives.   Physical Exam:BP 132/90 mmHg  Pulse 116  Ht 5\' 5"  (1.651 m)  Wt 102 lb 8 oz (46.494 kg)  BMI 17.06 kg/m2 General:  Well developed, thin female in  no acute distress Skin:  Warm and dry Neck:  Midline trachea, normal thyroid, good ROM, no lymphadenopathy Lungs; Clear to auscultation bilaterally Breast:  No dominant palpable mass, retraction, or nipple discharge Cardiovascular: Regular rate and rhythm Abdomen:  Soft, non tender, no hepatosplenomegaly Pelvic:  External genitalia is normal in appearance, no lesions.  The vagina is pale with loss of moisture and rugae. Urethra has no lesions or masses. The cervix and uterus are absent. No adnexal masses or tenderness noted.Bladder is non tender, no masses felt. Rectal: Good sphincter tone, no polyps, or hemorrhoids felt.  Hemoccult negative. Extremities/musculoskeletal:  No swelling or varicosities noted, no clubbing or cyanosis Psych:  No mood changes, alert and cooperative,seems happy Discussed using vaginal moisturizer and astro glide with sex and discussed estrogen therapy which she declines at present.She requests  to be checked for GC/CHL.  Impression: Well woman gyn exam no pap Vaginal dryness  Hot flashes    Plan: Get mammogram is past due Physical in 1 year Labs with PCP Colonoscopy per GI GC/CHL sent on urine Try luvena and astro glide

## 2015-02-27 NOTE — Patient Instructions (Signed)
Get mammogram now  Physical in 1 year Colonoscopy per GI Labs per PCP

## 2015-02-28 LAB — GC/CHLAMYDIA PROBE AMP
Chlamydia trachomatis, NAA: NEGATIVE
Neisseria gonorrhoeae by PCR: NEGATIVE

## 2016-06-18 IMAGING — CT CT HEAD W/O CM
1 series · 16 of 30 positions shown, 20 images · non-contrast
Comparison: 07/28/2011

CLINICAL DATA: Syncopal episodes for a month and a half. Has seen
primary care for same without diagnosis. Passed out twice today.
Dizziness and lightheadedness. Unsteady on feet.

EXAM:
CT HEAD WITHOUT CONTRAST
TECHNIQUE: Contiguous axial images were obtained from the base of the skull
through the vertex without intravenous contrast.

[Series 2: headtrauma 4.8 h37s · axial · 0.44mm/px · z∈[+56,+187]mm · 16 of 30 slices shown, 20 images]
[im 2/30  brain]
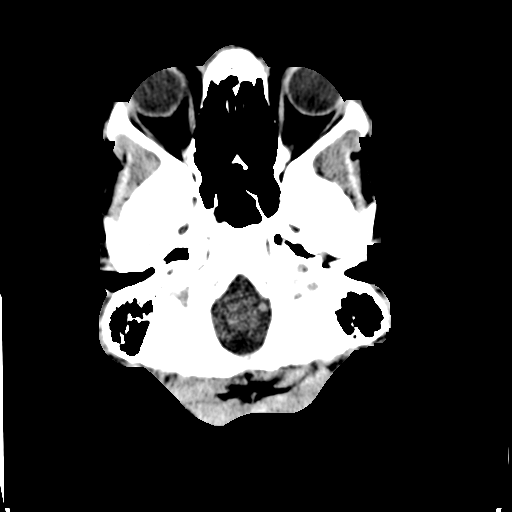
[im 2/30  bone]
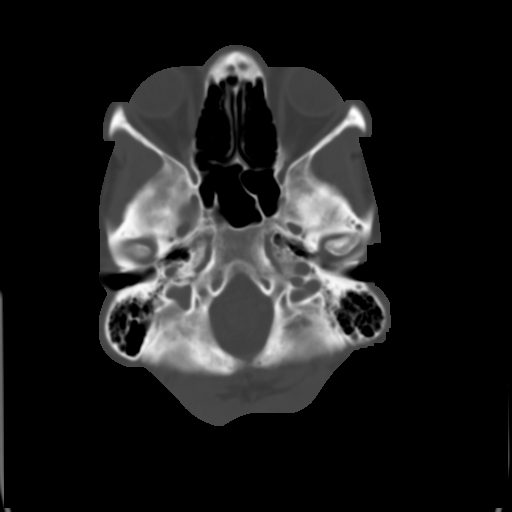
[im 4/30  brain]
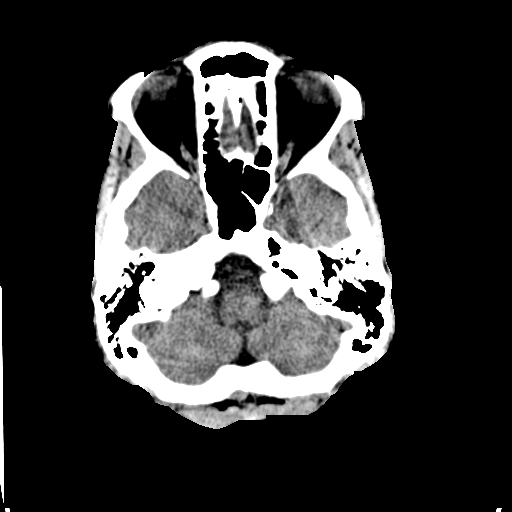
[im 6/30  brain]
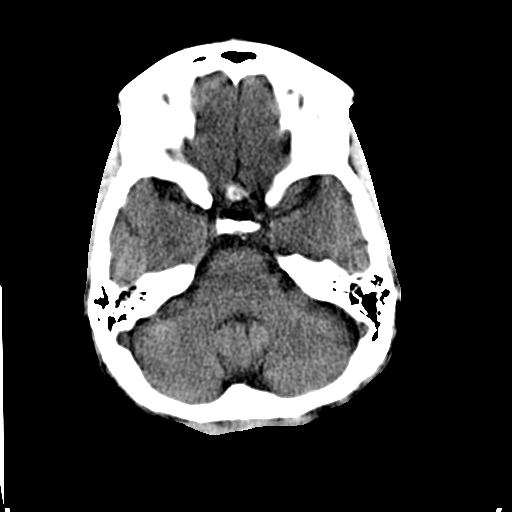
[im 8/30  brain]
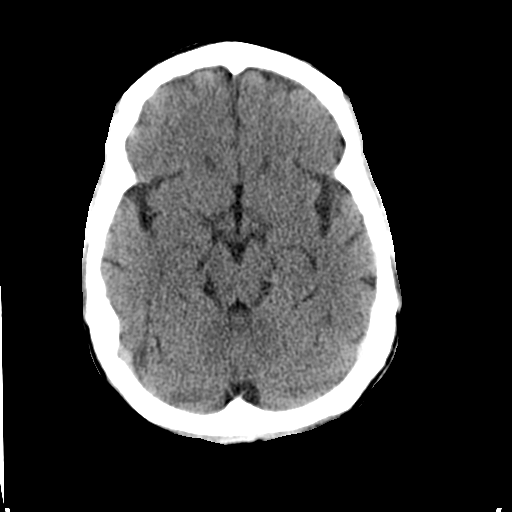
[im 9/30  brain]
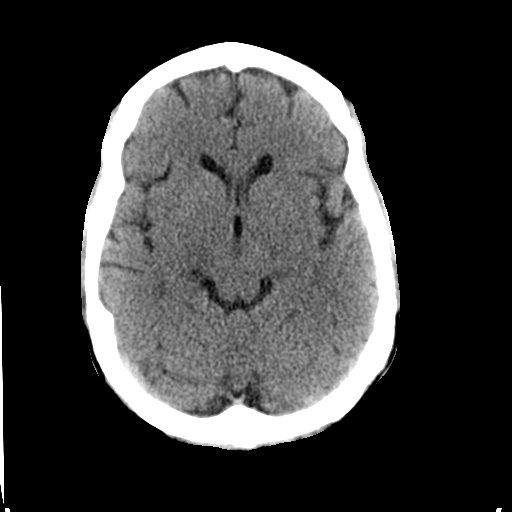
[im 9/30  bone]
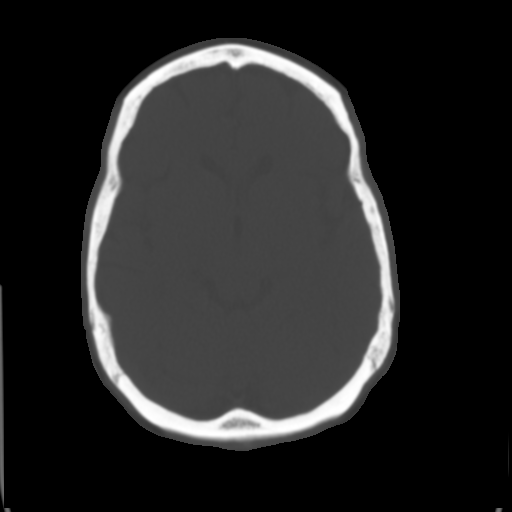
[im 11/30  brain]
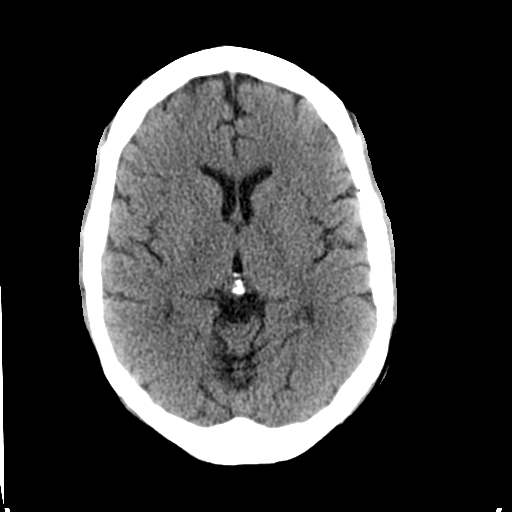
[im 13/30  brain]
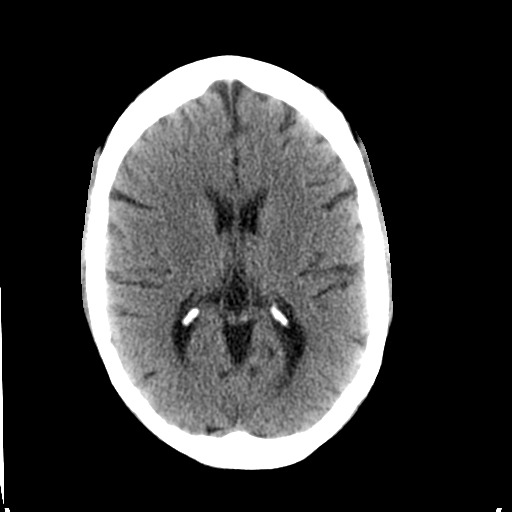
[im 15/30  brain]
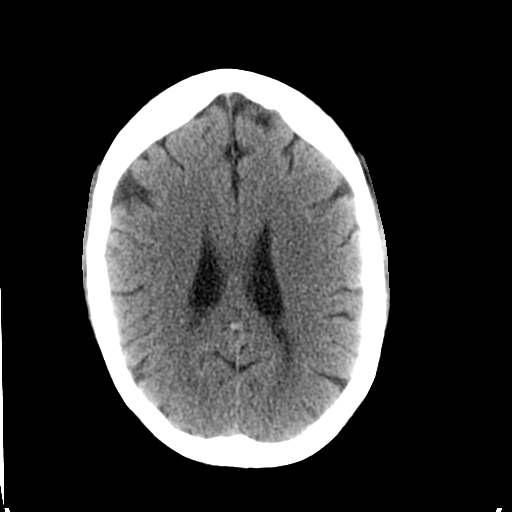
[im 16/30  brain]
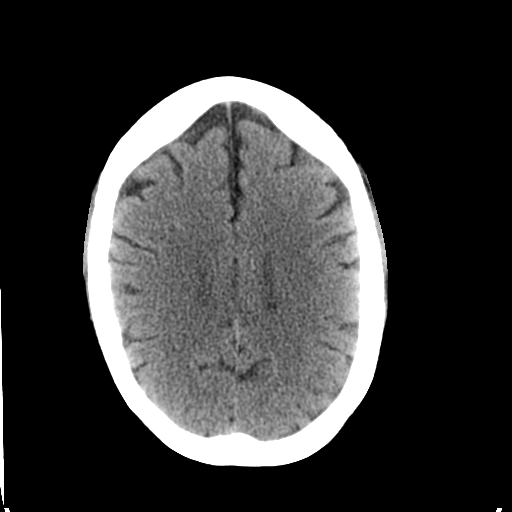
[im 16/30  bone]
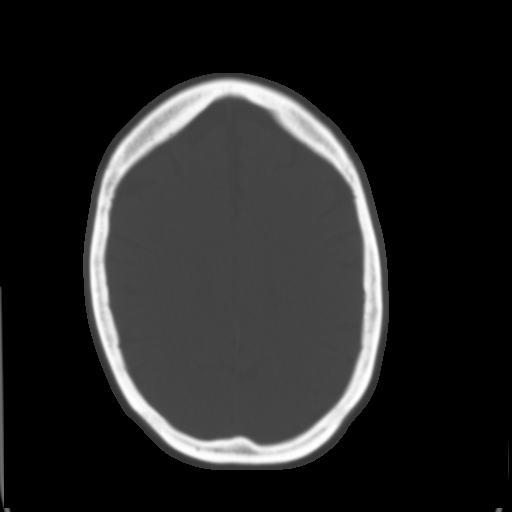
[im 18/30  brain]
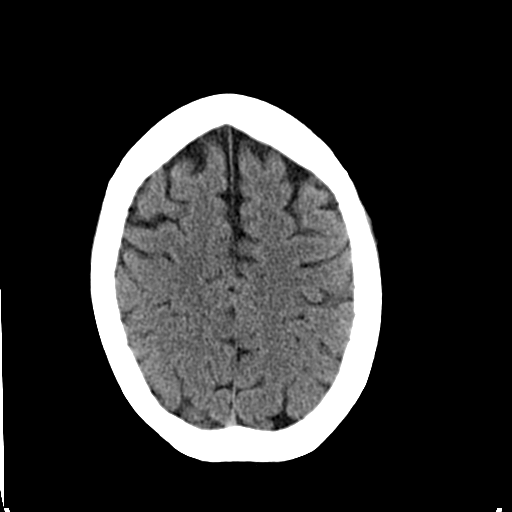
[im 20/30  brain]
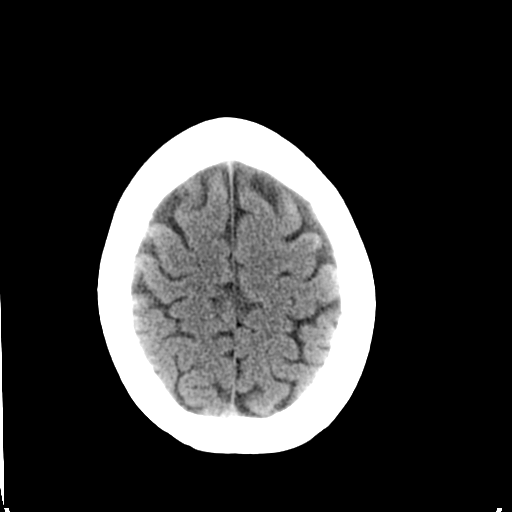
[im 22/30  brain]
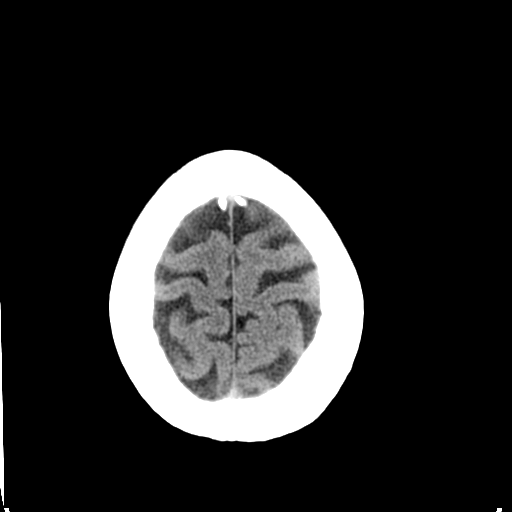
[im 23/30  brain]
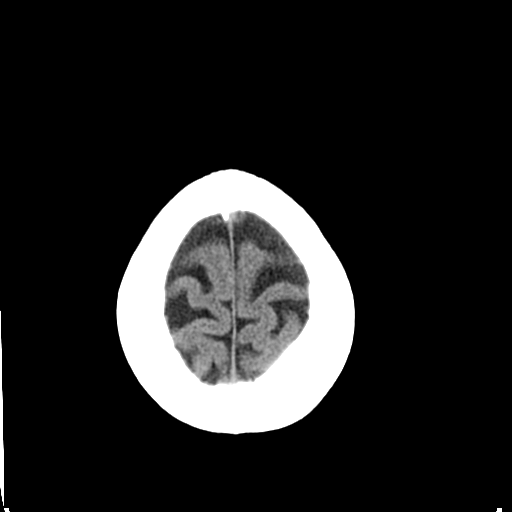
[im 23/30  bone]
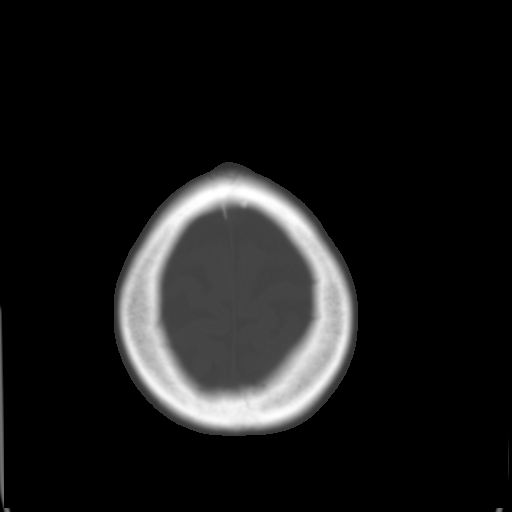
[im 25/30  brain]
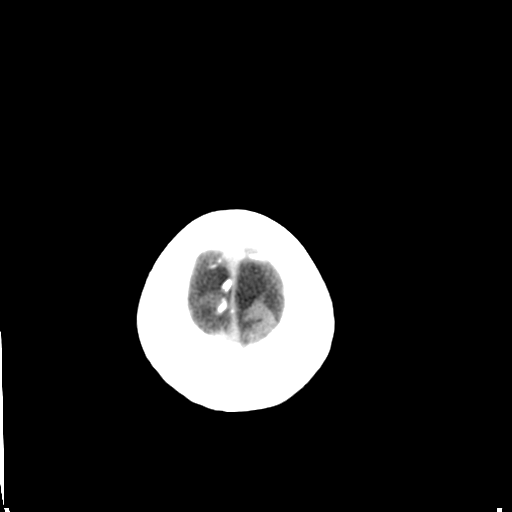
[im 27/30  brain]
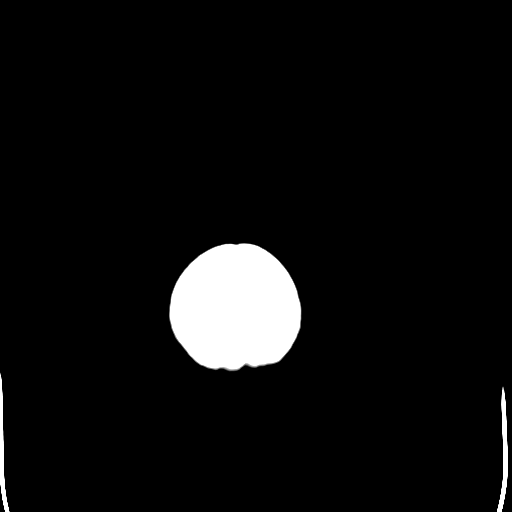
[im 29/30  brain]
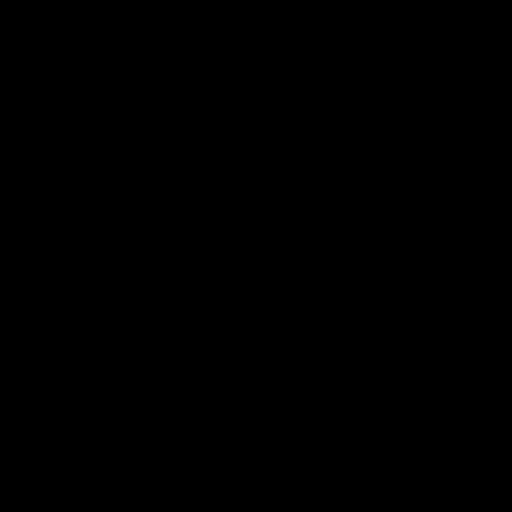

[16 of 30 positions shown; findings below may reference images not displayed]

FINDINGS: Mild cerebral atrophy. No ventricular dilatation. No mass effect or
midline shift. No abnormal extra-axial fluid collections. Gray-white
matter junctions are distinct. Basal cisterns are not effaced. No
evidence of acute intracranial hemorrhage. No depressed skull
fractures. Visualized paranasal sinuses and mastoid air cells are
not opacified.
IMPRESSION: No acute intracranial abnormalities.  Mild atrophy.

## 2016-12-01 ENCOUNTER — Encounter (HOSPITAL_COMMUNITY): Payer: Self-pay | Admitting: *Deleted

## 2016-12-01 ENCOUNTER — Emergency Department (HOSPITAL_COMMUNITY)
Admission: EM | Admit: 2016-12-01 | Discharge: 2016-12-01 | Disposition: A | Discharge status: Home / Self Care | Payer: 59 | Attending: Emergency Medicine | Admitting: Emergency Medicine

## 2016-12-01 DIAGNOSIS — F17210 Nicotine dependence, cigarettes, uncomplicated: Secondary | ICD-10-CM | POA: Insufficient documentation

## 2016-12-01 DIAGNOSIS — E860 Dehydration: Secondary | ICD-10-CM | POA: Insufficient documentation

## 2016-12-01 DIAGNOSIS — J45909 Unspecified asthma, uncomplicated: Secondary | ICD-10-CM | POA: Insufficient documentation

## 2016-12-01 DIAGNOSIS — Z79899 Other long term (current) drug therapy: Secondary | ICD-10-CM | POA: Diagnosis not present

## 2016-12-01 DIAGNOSIS — I10 Essential (primary) hypertension: Secondary | ICD-10-CM

## 2016-12-01 DIAGNOSIS — J449 Chronic obstructive pulmonary disease, unspecified: Secondary | ICD-10-CM | POA: Insufficient documentation

## 2016-12-01 DIAGNOSIS — R55 Syncope and collapse: Secondary | ICD-10-CM | POA: Insufficient documentation

## 2016-12-01 LAB — BASIC METABOLIC PANEL
ANION GAP: 14 (ref 5–15)
BUN: 12 mg/dL (ref 6–20)
CALCIUM: 10 mg/dL (ref 8.9–10.3)
CO2: 25 mmol/L (ref 22–32)
Chloride: 95 mmol/L — ABNORMAL LOW (ref 101–111)
Creatinine, Ser: 1.05 mg/dL — ABNORMAL HIGH (ref 0.44–1.00)
GLUCOSE: 96 mg/dL (ref 65–99)
POTASSIUM: 5 mmol/L (ref 3.5–5.1)
Sodium: 134 mmol/L — ABNORMAL LOW (ref 135–145)

## 2016-12-01 LAB — CBC WITH DIFFERENTIAL/PLATELET
BASOS ABS: 0 10*3/uL (ref 0.0–0.1)
BASOS PCT: 0 %
EOS PCT: 0 %
Eosinophils Absolute: 0 10*3/uL (ref 0.0–0.7)
HEMATOCRIT: 41.1 % (ref 36.0–46.0)
Hemoglobin: 14.7 g/dL (ref 12.0–15.0)
LYMPHS PCT: 8 %
Lymphs Abs: 0.7 10*3/uL (ref 0.7–4.0)
MCH: 32 pg (ref 26.0–34.0)
MCHC: 35.8 g/dL (ref 30.0–36.0)
MCV: 89.3 fL (ref 78.0–100.0)
MONO ABS: 0.6 10*3/uL (ref 0.1–1.0)
Monocytes Relative: 6 %
NEUTROS ABS: 8.4 10*3/uL — AB (ref 1.7–7.7)
Neutrophils Relative %: 86 %
PLATELETS: 272 10*3/uL (ref 150–400)
RBC: 4.6 MIL/uL (ref 3.87–5.11)
RDW: 12.4 % (ref 11.5–15.5)
WBC: 9.6 10*3/uL (ref 4.0–10.5)

## 2016-12-01 MED ORDER — LISINOPRIL 20 MG PO TABS: 20.0000 mg | ORAL_TABLET | Freq: Every day | ORAL | 0 refills | Status: DC

## 2016-12-01 MED ORDER — SODIUM CHLORIDE 0.9 % IV BOLUS (SEPSIS)
1000.0000 mL | Freq: Once | INTRAVENOUS | Status: AC
  Administered 2016-12-01: 1000 mL via INTRAVENOUS

## 2016-12-01 MED ORDER — HYDROCHLOROTHIAZIDE 25 MG PO TABS: 25.0000 mg | ORAL_TABLET | Freq: Every day | ORAL | 0 refills | Status: DC

## 2016-12-01 NOTE — ED Provider Notes (Signed)
[facility] DEPT Provider Note   CSN: [id number] Arrival date & time: 12/01/16  0102     History   Chief Complaint Chief Complaint  Patient presents with  . Loss of Consciousness    HPI MYLAN LENGYEL is a 51 y.o. female.  HPI  This a 51 year old female with a history of asthma, COPD, hypertension who presents with syncope. Patient reports that she had an episode of syncope twice yesterday. She reports a prodromal episode of "feeling like him to pass out." She states that she feels lightheaded. She denies any chest pain or shortness of breath during these episodes. States that she thinks she is staying hydrated but is unsure. Her lightheadedness is positional. She states she has been worked up before for passing out and "they never told me what was wrong." She denies any weakness, numbness, tingling, headache. She was notably hypertensive at triage. Reports that she has not been on blood pressure medications for over 1 year.  Past Medical History:  Diagnosis Date  . Asthma   . COPD (chronic obstructive pulmonary disease) ([place])   . Fibroids   . Hot flashes 02/27/2015  . Hypertension   . Mental disorder    bipolar, sees Dr Wylene Simmer  . Vaginal dryness 02/27/2015    Patient Active Problem List   Diagnosis Date Noted  . Hot flashes 02/27/2015  . Vaginal dryness 02/27/2015  . Orthostatic hypotension 11/08/2014  . Syncope 11/08/2014  . Reflux esophagitis   . Peptic ulcer disease   . Unintentional weight loss 06/28/2014  . Bipolar 1 disorder ([place]) 01/02/2013  . Hypertension 01/02/2013  . Syncope and collapse 07/28/2011  . Tachycardia 07/28/2011  . Hyponatremia 07/28/2011  . Dehydration 07/28/2011  . Leukocytosis 07/28/2011  . UTI (lower urinary tract infection) 07/28/2011    Past Surgical History:  Procedure Laterality Date  . ABDOMINAL HYSTERECTOMY    . COLONOSCOPY     remote past in [place].   [facility] COLONOSCOPY N/A 07/20/2014   RMR: Colonic diverticulosis. Single  colonic polyp removed as described above  . ESOPHAGOGASTRODUODENOSCOPY N/A 07/20/2014   RMR: Erosive reflux esophagitis. Hiatal hernia. Multiple gastric ulcers and erosinons status post biopsy.    OB History    Gravida Para Term Preterm AB Living   1       1     SAB TAB Ectopic Multiple Live Births   1               Home Medications    Prior to Admission medications   Medication Sig Start Date End Date Taking? Authorizing Provider  lamoTRIgine (LAMICTAL) 150 MG tablet Take 150 mg by mouth daily.    Yes [provider]  QUEtiapine (SEROQUEL) 25 MG tablet Take 25 mg by mouth at bedtime.   Yes [provider]  ziprasidone (GEODON) 60 MG capsule Take 60 mg by mouth daily.   Yes [provider]  amLODipine (NORVASC) 5 MG tablet Take 1 tablet (5 mg total) by mouth daily. 02/19/15   Elnora Morrison, MD  hydrochlorothiazide (HYDRODIURIL) 25 MG tablet Take 1 tablet (25 mg total) by mouth daily. 12/01/16   Nidia Grogan, Barbette Hair, MD  lisinopril (PRINIVIL,ZESTRIL) 20 MG tablet Take 1 tablet (20 mg total) by mouth daily. 12/01/16   Jadie Comas, Barbette Hair, MD    Family History Family History  Problem Relation Age of Onset  . Cancer Father        lung  . Cancer Mother  lymphoma  . Stroke Mother   . Hypertension Mother   . Heart murmur Mother   . Cancer Maternal Grandfather        prostate  . Colon cancer Neg Hx     Social History Social History  Substance Use Topics  . Smoking status: Current Every Day Smoker    Packs/day: 0.50    Years: 29.00    Types: Cigarettes  . Smokeless tobacco: Never Used  . Alcohol use 0.0 oz/week     Comment: social alcohol     Allergies   Codeine   Review of Systems Review of Systems  Constitutional: Negative for fever.  Respiratory: Negative for shortness of breath.   Cardiovascular: Negative for chest pain and leg swelling.  Gastrointestinal: Negative for abdominal pain, nausea and vomiting.  Neurological: Positive  for dizziness, syncope and light-headedness. Negative for seizures, weakness and numbness.  All other systems reviewed and are negative.    Physical Exam Updated Vital Signs BP (!) 201/122 (BP Location: Right Arm)   Pulse (!) 106   Temp 98.3 F (36.8 C) (Oral)   Resp 17   Ht 5\' 6"  (1.676 m)   Wt 55.8 kg (123 lb)   SpO2 100%   BMI 19.85 kg/m   Physical Exam  Constitutional: She is oriented to person, place, and time. She appears well-developed and well-nourished. No distress.  Smells of smoke  HENT:  Head: Normocephalic and atraumatic.  Eyes: Pupils are equal, round, and reactive to light.  Cardiovascular: Normal rate, regular rhythm and normal heart sounds.   No murmur heard. Pulmonary/Chest: Effort normal. No respiratory distress. She has wheezes.  Diffuse expiratory wheezing  Abdominal: Soft. There is no tenderness.  Musculoskeletal: She exhibits no edema.  Neurological: She is alert and oriented to person, place, and time.  Cranial nerves II through XII intact, 5 out of 5 strength in all 4 extremities, no dysmetria to finger-nose-finger, normal gait  Skin: Skin is warm and dry.  Psychiatric: She has a normal mood and affect.  Nursing note and vitals reviewed.    ED Treatments / Results  Labs (all labs ordered are listed, but only abnormal results are displayed) Labs Reviewed  CBC WITH DIFFERENTIAL/PLATELET - Abnormal; Notable for the following:       Result Value   Neutro Abs 8.4 (*)    All other components within normal limits  BASIC METABOLIC PANEL - Abnormal; Notable for the following:    Sodium 134 (*)    Chloride 95 (*)    Creatinine, Ser 1.05 (*)    All other components within normal limits    EKG  EKG Interpretation  Date/Time:  Tuesday December 01 2016 02:00:55 EDT Ventricular Rate:  110 PR Interval:    QRS Duration: 86 QT Interval:  358 QTC Calculation: 485 R Axis:   48 Text Interpretation:  Sinus tachycardia Ventricular premature complex  Consider right atrial enlargement Baseline wander in lead(s) V1 More tachycardic when compared to prior Confirmed by Thayer Jew [phone]) on 12/01/2016 2:38:19 AM       Radiology No results found.  Procedures Procedures (including critical care time)  Medications Ordered in ED Medications  sodium chloride 0.9 % bolus 1,000 mL (0 mLs Intravenous Stopped 12/01/16 0313)  sodium chloride 0.9 % bolus 1,000 mL (1,000 mLs Intravenous New Bag/Given 12/01/16 0315)     Initial Impression / Assessment and Plan / ED Course  I have reviewed the triage vital signs and the nursing notes.  Pertinent labs &  imaging results that were available during my care of the patient were reviewed by me and considered in my medical decision making (see chart for details).  Clinical Course as of Dec 02 411  Tue Dec 01, 2016  1601 Patient ambulated. Felt dizzy upon initially standing. Still mildly tachycardic but much improved with fluids. Additional 1 L fluid ordered.  [CH]    Clinical Course User Index [CH] Wilder Kurowski, Barbette Hair, MD    Patient presents with 2 episodes of syncope yesterday. Notably hypertensive upon initial evaluation. Otherwise nonfocal. Reports that there is a prodrome to the syncope and a positional component to her lightheadedness. Suggest likely some element of dehydration. EKG shows no evidence of arrhythmia. Basic labwork is reassuring. Patient was given 2 L of fluid and had progressive improvement of her symptoms. Initially her heart rate went up 15 points upon standing. She was not technically orthostatic but was tachycardic initially in the low 100s. She denies chest pain or shortness of breath. Doubt PE. Patient is ambulatory independently. She is remains nonfocal. She states she feels much better after fluids. Will discharge home. Recommend aggressive hydration. We'll also restart blood pressure medications and recommend close follow-up with primary physician for recheck of blood  pressure.  After history, exam, and medical workup I feel the patient has been appropriately medically screened and is safe for discharge home. Pertinent diagnoses were discussed with the patient. Patient was given return precautions.   Final Clinical Impressions(s) / ED Diagnoses   Final diagnoses:  Vasovagal syncope  Dehydration  Essential hypertension    New Prescriptions New Prescriptions   HYDROCHLOROTHIAZIDE (HYDRODIURIL) 25 MG TABLET    Take 1 tablet (25 mg total) by mouth daily.   LISINOPRIL (PRINIVIL,ZESTRIL) 20 MG TABLET    Take 1 tablet (20 mg total) by mouth daily.     Merryl Hacker, MD 12/01/16 [phone]

## 2016-12-01 NOTE — Discharge Instructions (Signed)
You were seen today for passing out. He likely have some element of dehydration. However, your blood pressure was also elevated. You need just reinitiate blood pressure medications. Follow up very closely with your primary physician.

## 2016-12-01 NOTE — ED Triage Notes (Signed)
Pt states she got up tonight to get ready for work & felt like she was going to pass out. Says she did hit the floor. Has happened x 2 yesterday.

## 2017-03-14 ENCOUNTER — Inpatient Hospital Stay (HOSPITAL_COMMUNITY): Payer: 59

## 2017-03-14 ENCOUNTER — Inpatient Hospital Stay (HOSPITAL_COMMUNITY)
Admission: EM | Admit: 2017-03-14 | Discharge: 2017-03-16 | DRG: 640 | Disposition: A | Discharge status: Home / Self Care | Payer: 59 | Attending: Family Medicine | Admitting: Family Medicine

## 2017-03-14 ENCOUNTER — Encounter (HOSPITAL_COMMUNITY): Payer: Self-pay | Admitting: Emergency Medicine

## 2017-03-14 DIAGNOSIS — E872 Acidosis: Secondary | ICD-10-CM | POA: Diagnosis present

## 2017-03-14 DIAGNOSIS — Z79899 Other long term (current) drug therapy: Secondary | ICD-10-CM

## 2017-03-14 DIAGNOSIS — J45909 Unspecified asthma, uncomplicated: Secondary | ICD-10-CM

## 2017-03-14 DIAGNOSIS — F10188 Alcohol abuse with other alcohol-induced disorder: Secondary | ICD-10-CM | POA: Diagnosis present

## 2017-03-14 DIAGNOSIS — J189 Pneumonia, unspecified organism: Secondary | ICD-10-CM | POA: Diagnosis present

## 2017-03-14 DIAGNOSIS — K279 Peptic ulcer, site unspecified, unspecified as acute or chronic, without hemorrhage or perforation: Secondary | ICD-10-CM | POA: Diagnosis not present

## 2017-03-14 DIAGNOSIS — I10 Essential (primary) hypertension: Secondary | ICD-10-CM | POA: Diagnosis present

## 2017-03-14 DIAGNOSIS — R0989 Other specified symptoms and signs involving the circulatory and respiratory systems: Secondary | ICD-10-CM | POA: Diagnosis not present

## 2017-03-14 DIAGNOSIS — F319 Bipolar disorder, unspecified: Secondary | ICD-10-CM | POA: Diagnosis present

## 2017-03-14 DIAGNOSIS — R05 Cough: Secondary | ICD-10-CM

## 2017-03-14 DIAGNOSIS — E860 Dehydration: Secondary | ICD-10-CM | POA: Diagnosis present

## 2017-03-14 DIAGNOSIS — F1010 Alcohol abuse, uncomplicated: Secondary | ICD-10-CM

## 2017-03-14 DIAGNOSIS — N179 Acute kidney failure, unspecified: Secondary | ICD-10-CM | POA: Diagnosis present

## 2017-03-14 DIAGNOSIS — E871 Hypo-osmolality and hyponatremia: Secondary | ICD-10-CM | POA: Diagnosis not present

## 2017-03-14 DIAGNOSIS — T473X5A Adverse effect of saline and osmotic laxatives, initial encounter: Secondary | ICD-10-CM | POA: Diagnosis present

## 2017-03-14 DIAGNOSIS — R441 Visual hallucinations: Secondary | ICD-10-CM | POA: Diagnosis present

## 2017-03-14 DIAGNOSIS — R059 Cough, unspecified: Secondary | ICD-10-CM

## 2017-03-14 LAB — BASIC METABOLIC PANEL
Anion gap: 6 (ref 5–15)
Anion gap: 6 (ref 5–15)
BUN: 6 mg/dL (ref 6–20)
BUN: 8 mg/dL (ref 6–20)
CALCIUM: 8.2 mg/dL — AB (ref 8.9–10.3)
CALCIUM: 8.9 mg/dL (ref 8.9–10.3)
CHLORIDE: 93 mmol/L — AB (ref 101–111)
CHLORIDE: 96 mmol/L — AB (ref 101–111)
CO2: 20 mmol/L — AB (ref 22–32)
CO2: 21 mmol/L — AB (ref 22–32)
CREATININE: 1.18 mg/dL — AB (ref 0.44–1.00)
CREATININE: 1.09 mg/dL — AB (ref 0.44–1.00)
GFR calc Af Amer: >60 mL/min (ref >60)
GFR calc Af Amer: >60 mL/min (ref >60)
GFR calc non Af Amer: 53 mL/min — ABNORMAL LOW (ref >60)
GFR calc non Af Amer: 59 mL/min — ABNORMAL LOW (ref >60)
Glucose, Bld: 101 mg/dL — ABNORMAL HIGH (ref 65–99)
Glucose, Bld: 93 mg/dL (ref 65–99)
Potassium: 3.8 mmol/L (ref 3.5–5.1)
Potassium: 3.6 mmol/L (ref 3.5–5.1)
SODIUM: 119 mmol/L — AB (ref 135–145)
Sodium: 123 mmol/L — ABNORMAL LOW (ref 135–145)

## 2017-03-14 LAB — URINALYSIS, ROUTINE W REFLEX MICROSCOPIC
BILIRUBIN URINE: NEGATIVE
Glucose, UA: NEGATIVE mg/dL
HGB URINE DIPSTICK: NEGATIVE
Ketones, ur: NEGATIVE mg/dL
Leukocytes, UA: NEGATIVE
Nitrite: NEGATIVE
PROTEIN: NEGATIVE mg/dL
SPECIFIC GRAVITY, URINE: 1.003 — AB (ref 1.005–1.030)
pH: 7 (ref 5.0–8.0)

## 2017-03-14 LAB — ETHANOL

## 2017-03-14 LAB — SODIUM, URINE, RANDOM: SODIUM UR: 55 mmol/L

## 2017-03-14 LAB — COMPREHENSIVE METABOLIC PANEL
ALT: 20 U/L (ref 14–54)
ANION GAP: 10 (ref 5–15)
AST: 33 U/L (ref 15–41)
Albumin: 3.5 g/dL (ref 3.5–5.0)
Alkaline Phosphatase: 100 U/L (ref 38–126)
BUN: 8 mg/dL (ref 6–20)
CHLORIDE: 88 mmol/L — AB (ref 101–111)
CO2: 21 mmol/L — ABNORMAL LOW (ref 22–32)
CREATININE: 1.13 mg/dL — AB (ref 0.44–1.00)
Calcium: 8.9 mg/dL (ref 8.9–10.3)
GFR calc non Af Amer: 56 mL/min — ABNORMAL LOW (ref >60)
Glucose, Bld: 95 mg/dL (ref 65–99)
POTASSIUM: 4.3 mmol/L (ref 3.5–5.1)
SODIUM: 119 mmol/L — AB (ref 135–145)
Total Bilirubin: 0.2 mg/dL — ABNORMAL LOW (ref 0.3–1.2)
Total Protein: 6.6 g/dL (ref 6.5–8.1)

## 2017-03-14 LAB — AMMONIA: AMMONIA: 29 umol/L (ref 9–35)

## 2017-03-14 LAB — CBC WITH DIFFERENTIAL/PLATELET
Basophils Absolute: 0 10*3/uL (ref 0.0–0.1)
Basophils Relative: 0 %
EOS ABS: 0 10*3/uL (ref 0.0–0.7)
EOS PCT: 0 %
HCT: 33.9 % — ABNORMAL LOW (ref 36.0–46.0)
Hemoglobin: 12.1 g/dL (ref 12.0–15.0)
LYMPHS ABS: 1 10*3/uL (ref 0.7–4.0)
Lymphocytes Relative: 19 %
MCH: 31 pg (ref 26.0–34.0)
MCHC: 35.7 g/dL (ref 30.0–36.0)
MCV: 86.9 fL (ref 78.0–100.0)
Monocytes Absolute: 0.8 10*3/uL (ref 0.1–1.0)
Monocytes Relative: 15 %
Neutro Abs: 3.5 10*3/uL (ref 1.7–7.7)
Neutrophils Relative %: 66 %
PLATELETS: 233 10*3/uL (ref 150–400)
RBC: 3.9 MIL/uL (ref 3.87–5.11)
RDW: 12.5 % (ref 11.5–15.5)
WBC: 5.4 10*3/uL (ref 4.0–10.5)

## 2017-03-14 LAB — OSMOLALITY: Osmolality: 250 mOsm/kg — ABNORMAL LOW (ref 275–295)

## 2017-03-14 LAB — OSMOLALITY, URINE: Osmolality, Ur: 168 mOsm/kg — ABNORMAL LOW (ref 300–900)

## 2017-03-14 LAB — TSH: TSH: 3.601 u[IU]/mL (ref 0.350–4.500)

## 2017-03-14 LAB — STREP PNEUMONIAE URINARY ANTIGEN: STREP PNEUMO URINARY ANTIGEN: NEGATIVE

## 2017-03-14 MED ORDER — ACETAMINOPHEN 650 MG RE SUPP: 650.0000 mg | Freq: Four times a day (QID) | RECTAL | Status: DC | PRN

## 2017-03-14 MED ORDER — HYDRALAZINE HCL 20 MG/ML IJ SOLN
5.0000 mg | Freq: Four times a day (QID) | INTRAMUSCULAR | Status: DC | PRN
  Administered 2017-03-14: 5 mg via INTRAVENOUS
  Filled 2017-03-14: qty 1

## 2017-03-14 MED ORDER — FOLIC ACID 1 MG PO TABS
1.0000 mg | ORAL_TABLET | Freq: Every day | ORAL | Status: DC
  Administered 2017-03-14 – 2017-03-15 (×2): 1 mg via ORAL
  Filled 2017-03-14 (×2): qty 1

## 2017-03-14 MED ORDER — SENNOSIDES-DOCUSATE SODIUM 8.6-50 MG PO TABS: 1.0000 | ORAL_TABLET | Freq: Every evening | ORAL | Status: DC | PRN

## 2017-03-14 MED ORDER — THIAMINE HCL 100 MG/ML IJ SOLN: 100.0000 mg | Freq: Every day | INTRAMUSCULAR | Status: DC

## 2017-03-14 MED ORDER — QUETIAPINE FUMARATE 25 MG PO TABS: 25.0000 mg | ORAL_TABLET | Freq: Every day | ORAL | Status: DC

## 2017-03-14 MED ORDER — LAMOTRIGINE 25 MG PO TABS
150.0000 mg | ORAL_TABLET | Freq: Every day | ORAL | Status: DC
  Administered 2017-03-14 – 2017-03-16 (×3): 150 mg via ORAL
  Filled 2017-03-14 (×2): qty 2
  Filled 2017-03-14: qty 1

## 2017-03-14 MED ORDER — ENOXAPARIN SODIUM 40 MG/0.4ML ~~LOC~~ SOLN
40.0000 mg | SUBCUTANEOUS | Status: DC
  Administered 2017-03-14 – 2017-03-15 (×2): 40 mg via SUBCUTANEOUS
  Filled 2017-03-14 (×3): qty 0.4

## 2017-03-14 MED ORDER — SODIUM CHLORIDE 0.9 % IV SOLN
INTRAVENOUS | Status: DC
  Administered 2017-03-14 – 2017-03-16 (×2): via INTRAVENOUS

## 2017-03-14 MED ORDER — ACETAMINOPHEN 325 MG PO TABS
650.0000 mg | ORAL_TABLET | Freq: Four times a day (QID) | ORAL | Status: DC | PRN
Start: 2017-03-14 — End: 2017-03-16

## 2017-03-14 MED ORDER — VITAMIN B-1 100 MG PO TABS
100.0000 mg | ORAL_TABLET | Freq: Every day | ORAL | Status: DC
  Administered 2017-03-14 – 2017-03-15 (×2): 100 mg via ORAL
  Filled 2017-03-14 (×2): qty 1

## 2017-03-14 MED ORDER — CEFTRIAXONE SODIUM 1 G IJ SOLR
1.0000 g | INTRAMUSCULAR | Status: DC
  Administered 2017-03-14 – 2017-03-15 (×2): 1 g via INTRAVENOUS
  Filled 2017-03-14 (×4): qty 10

## 2017-03-14 MED ORDER — AZITHROMYCIN 500 MG PO TABS
500.0000 mg | ORAL_TABLET | ORAL | Status: DC
  Administered 2017-03-14 – 2017-03-16 (×3): 500 mg via ORAL
  Filled 2017-03-14 (×2): qty 1
  Filled 2017-03-14: qty 2
  Filled 2017-03-14: qty 1

## 2017-03-14 MED ORDER — LORAZEPAM 1 MG PO TABS: 1.0000 mg | ORAL_TABLET | Freq: Four times a day (QID) | ORAL | Status: DC | PRN

## 2017-03-14 MED ORDER — ONDANSETRON HCL 4 MG/2ML IJ SOLN: 4.0000 mg | Freq: Four times a day (QID) | INTRAMUSCULAR | Status: DC | PRN

## 2017-03-14 MED ORDER — ADULT MULTIVITAMIN W/MINERALS CH
1.0000 | ORAL_TABLET | Freq: Every day | ORAL | Status: DC
  Administered 2017-03-14 – 2017-03-15 (×2): 1 via ORAL
  Filled 2017-03-14 (×2): qty 1

## 2017-03-14 MED ORDER — SODIUM CHLORIDE 0.9 % IV BOLUS (SEPSIS)
1000.0000 mL | Freq: Once | INTRAVENOUS | Status: AC
  Administered 2017-03-14: 1000 mL via INTRAVENOUS

## 2017-03-14 MED ORDER — LISINOPRIL 40 MG PO TABS
40.0000 mg | ORAL_TABLET | Freq: Every day | ORAL | Status: DC
  Administered 2017-03-14 – 2017-03-16 (×3): 40 mg via ORAL
  Filled 2017-03-14 (×3): qty 1

## 2017-03-14 MED ORDER — LORAZEPAM 2 MG/ML IJ SOLN: 1.0000 mg | Freq: Four times a day (QID) | INTRAMUSCULAR | Status: DC | PRN

## 2017-03-14 MED ORDER — ONDANSETRON HCL 4 MG PO TABS
4.0000 mg | ORAL_TABLET | Freq: Four times a day (QID) | ORAL | Status: DC | PRN
Start: 2017-03-14 — End: 2017-03-16

## 2017-03-14 MED ORDER — SODIUM CHLORIDE 0.9 % IV SOLN
INTRAVENOUS | Status: DC
  Administered 2017-03-14: 11:00:00 via INTRAVENOUS

## 2017-03-14 MED ORDER — DILTIAZEM HCL ER BEADS 240 MG PO CP24
360.0000 mg | ORAL_CAPSULE | Freq: Every day | ORAL | Status: DC
  Administered 2017-03-14: 15:00:00 360 mg via ORAL
  Filled 2017-03-14 (×2): qty 1

## 2017-03-14 NOTE — H&P (Signed)
History and Physical    Bethany Espinoza SNK:[id number] DOB: 1966-05-20 DOA: 03/14/2017   PCP: Sharilyn Sites, MD   Patient coming from:  Home    Chief Complaint:  HPI: Bethany Espinoza is a 51 y.o. female with medical history significant for alcohol abuse, hypertension, COPD, bipolar disorder, presenting for evaluation of hyponatremia.,In review, the patient has lost her job about 3 weeks ago, drinking one case of beer daily. She checked into fellowship about 5 days ago, at the time noting to have a sodium of 130, with ammonia level of 100. The patient was started on Librium taper and lactulose. Labs yesterday, noted her sodium to go down to 119, for which she was sent for evaluation here. She reports feeling very weak, "foggy", and well as reporting visual hallucinations, a wear of the disease. She denies suicidal thoughts. She denies any fevers, chills, nausea or vomiting. She has some loose stools, in the setting of lactulose She denies taking alcohol for the last 5 days. She denies any abdominal pain. She denies any dysuria or gross hematuria. She denies any lower extremity swelling, or calf tenderness. She denies any muscle cramps. She denies any blurred or double vision. She denies any headaches. No seizures reported.office note, she has recently been started on lisinopril revealed for the treatment of blood pressure.   ED Course:  BP (!) 173/92   Pulse 95   Temp 98.3 F (36.8 C) (Oral)   Resp 14   Ht 5\' 6"  (1.676 m)   Wt 59 kg (130 lb)   SpO2 97%   BMI 20.98 kg/m    received 1 L of IV fluids Sodium was 119 on admission, chloride 88, bicarb 21   White count 5.4 . U osmoloality is 85 and Urine sodium is 18 Creatinine 1.13 Total bilirubin 0.2 Ammonia 25  EKG had sinus tachycardia on admission, otherwise normal  Review of Systems:  As per HPI otherwise all other systems reviewed and are negative  Past Medical History:  Diagnosis Date  . Asthma   . COPD (chronic obstructive  pulmonary disease) ([place])   . Fibroids   . Hot flashes 02/27/2015  . Hypertension   . Mental disorder    bipolar, sees Dr Wylene Simmer  . Vaginal dryness 02/27/2015    Past Surgical History:  Procedure Laterality Date  . ABDOMINAL HYSTERECTOMY    . COLONOSCOPY     remote past in [place].   Marland Kitchen COLONOSCOPY N/A 07/20/2014   RMR: Colonic diverticulosis. Single colonic polyp removed as described above  . ESOPHAGOGASTRODUODENOSCOPY N/A 07/20/2014   RMR: Erosive reflux esophagitis. Hiatal hernia. Multiple gastric ulcers and erosinons status post biopsy.    Social History Social History   Social History  . Marital status: Single    Spouse name: N/A  . Number of children: N/A  . Years of education: N/A   Occupational History  . [employer]    Social History Main Topics  . Smoking status: Current Every Day Smoker    Packs/day: 0.50    Years: 29.00    Types: Cigarettes  . Smokeless tobacco: Never Used  . Alcohol use 0.0 oz/week     Comment: social alcohol  . Drug use: No  . Sexual activity: Yes    Birth control/ protection: Surgical     Comment: hyst   Other Topics Concern  . Not on file   Social History Narrative  . No narrative on file     Allergies  Allergen Reactions  .  Lithium   . Codeine Hives and Rash    Not in right state of mind    Family History  Problem Relation Age of Onset  . Cancer Father        lung  . Cancer Mother        lymphoma  . Stroke Mother   . Hypertension Mother   . Heart murmur Mother   . Cancer Maternal Grandfather        prostate  . Colon cancer Neg Hx       Prior to Admission medications   Medication Sig Start Date End Date Taking? Authorizing Provider  chlordiazePOXIDE (LIBRIUM) 5 MG capsule Take 5 mg by mouth 3 (three) times daily as needed for anxiety.   Yes [provider]  cyproheptadine (PERIACTIN) 4 MG tablet Take 4 mg by mouth at bedtime.   Yes [provider]  DiazePAM 10 MG/2ML SOAJ Inject 10 mg  into the muscle as needed (SEIZURE).   Yes [provider]  ibuprofen (ADVIL,MOTRIN) 600 MG tablet Take 600 mg by mouth every 6 (six) hours as needed for fever or mild pain.   Yes [provider]  lactulose (CHRONULAC) 10 GM/15ML solution Take 20 g by mouth 3 (three) times daily.   Yes [provider]  Multiple Vitamin (MULTIVITAMIN WITH MINERALS) TABS tablet Take 1 tablet by mouth daily.   Yes [provider]  ondansetron (ZOFRAN) 8 MG tablet Take 8 mg by mouth 2 (two) times daily as needed for nausea or vomiting.   Yes [provider]  thiamine (VITAMIN B-1) 100 MG tablet Take 100 mg by mouth daily.   Yes [provider]  amLODipine (NORVASC) 5 MG tablet Take 1 tablet (5 mg total) by mouth daily. Patient not taking: Reported on 03/14/2017 02/19/15   Bethany Morrison, MD  diltiazem [facility]) 360 MG 24 hr capsule Take 360 mg by mouth daily. 02/26/17   [provider]  hydrochlorothiazide (HYDRODIURIL) 25 MG tablet Take 1 tablet (25 mg total) by mouth daily. Patient not taking: Reported on 03/14/2017 12/01/16   Horton, Barbette Hair, MD  lamoTRIgine (LAMICTAL) 150 MG tablet Take 150 mg by mouth daily.     [provider]  lisinopril (PRINIVIL,ZESTRIL) 20 MG tablet Take 1 tablet (20 mg total) by mouth daily. Patient taking differently: Take 40 mg by mouth daily.  12/01/16   Horton, Barbette Hair, MD  QUEtiapine (SEROQUEL) 25 MG tablet Take 25 mg by mouth at bedtime.    [provider]  ziprasidone (GEODON) 60 MG capsule Take 60 mg by mouth daily.    [provider]    Physical Exam:  Vitals:   03/14/17 0830 03/14/17 0900 03/14/17 0915 03/14/17 0919  BP: (!) 173/92     Pulse: 92 96 98 95  Resp: 16 12 13 14   Temp:      TempSrc:      SpO2: 100% 100% 100% 97%  Weight:      Height:       Constitutional: NAD, calm, lethargic appearing, somewhat confused, but corrects herself, ("I am in [facility], no, I am at  [facility]) Eyes: PERRL, lids and conjunctivae normal ENMT: Mucous membranes are dry, without exudate or lesions  Neck: normal, supple, no masses, no thyromegaly Respiratory: clear to auscultation bilaterally, no wheezing, no crackles. Normal respiratory effort  Cardiovascular: Regular rate and rhythm,  murmur, rubs or gallops. No extremity edema. 2+ pedal pulses. No carotid bruits.  Abdomen: Soft, non tender,  No hepatosplenomegaly. Bowel sounds positive.  Musculoskeletal: no clubbing / cyanosis. Moves all extremities Skin: no jaundice, No lesions.  Neurologic: Sensation intact  Strength equal in all extremities. No old used tremors or asterixis. Psychiatric:   Alert and oriented x 3 after thinking through . Lethargic     Labs on Admission: I have personally reviewed following labs and imaging studies  CBC:  Recent Labs Lab 03/14/17 0738  WBC 5.4  NEUTROABS 3.5  HGB 12.1  HCT 33.9*  MCV 86.9  PLT 481    Basic Metabolic Panel:  Recent Labs Lab 03/14/17 0738  NA 119*  K 4.3  CL 88*  CO2 21*  GLUCOSE 95  BUN 8  CREATININE 1.13*  CALCIUM 8.9    GFR: Estimated Creatinine Clearance: 56.1 mL/min (A) (by C-G formula based on SCr of 1.13 mg/dL (H)).  Liver Function Tests:  Recent Labs Lab 03/14/17 0738  AST 33  ALT 20  ALKPHOS 100  BILITOT 0.2*  PROT 6.6  ALBUMIN 3.5   No results for input(s): LIPASE, AMYLASE in the last 168 hours.  Recent Labs Lab 03/14/17 0807  AMMONIA 29    Coagulation Profile: No results for input(s): INR, PROTIME in the last 168 hours.  Cardiac Enzymes: No results for input(s): CKTOTAL, CKMB, CKMBINDEX, TROPONINI in the last 168 hours.  BNP (last 3 results) No results for input(s): PROBNP in the last 8760 hours.  HbA1C: No results for input(s): HGBA1C in the last 72 hours.  CBG: No results for input(s): GLUCAP in the last 168 hours.  Lipid Profile: No results for input(s): CHOL, HDL, LDLCALC, TRIG, CHOLHDL, LDLDIRECT in the  last 72 hours.  Thyroid Function Tests: No results for input(s): TSH, T4TOTAL, FREET4, T3FREE, THYROIDAB in the last 72 hours.  Anemia Panel: No results for input(s): VITAMINB12, FOLATE, FERRITIN, TIBC, IRON, RETICCTPCT in the last 72 hours.  Urine analysis:    Component Value Date/Time   COLORURINE AMBER (A) 07/28/2011 1350   APPEARANCEUR CLEAR 07/28/2011 1350   LABSPEC 1.010 07/28/2011 1350   PHURINE 6.5 07/28/2011 1350   GLUCOSEU 100 (A) 07/28/2011 1350   HGBUR TRACE (A) 07/28/2011 1350   BILIRUBINUR MODERATE (A) 07/28/2011 1350   KETONESUR 15 (A) 07/28/2011 1350   PROTEINUR 100 (A) 07/28/2011 1350   UROBILINOGEN 1.0 07/28/2011 1350   NITRITE POSITIVE (A) 07/28/2011 1350   LEUKOCYTESUR NEGATIVE 07/28/2011 1350    Sepsis Labs: @LABRCNTIP (procalcitonin:4,lacticidven:4) )No results found for this or any previous visit (from the past 240 hour(s)).   Radiological Exams on Admission: No results found.  EKG: Independently reviewed.  Assessment/Plan Active Problems:   Hyponatremia   Dehydration   Peptic ulcer disease   Asthma   Alcohol abuse   AKI (acute kidney injury) ([place])   Hyponatremia  likely due to Lactulose versus lisinopril. No seizure activity, or asterixis The patient appeared dehydrated, which may have contributed to hyponatremic. sodium was 130 once checking on the fellowship, dropping to 119 after the initiation of lactulose for the treatment of alcohol intoxication. Ammonia is 29 (was 102 on 10/16).   White count 5.4. U osmolality is 85 and Urine sodium is 18 The patient is afebrile  Admit to IP tele  Continue IV hydration and follow BMP q 6 hours. Free  water restriction to 1500 mL a day  IV Normal saline, but avoid correcting more than 10 meq in 24 h   TSH Cortisol level  Hold Lactulose  Alcohol abuse  at risk for withdrawal. Last drink  5 days ago . ETOH levels pending . Ammonia normal   Telemetry  CIWA with Ativan per protocol Thiamine, folate, and  MVI No further lactulose needed    Acute Kidney Injury likely due to dehydration vs.ACEI   BL Cr 0.88 , currently 1.13  Lab Results  Component Value Date   CREATININE 1.13 (H) 03/14/2017   CREATININE 1.05 (H) 12/01/2016   CREATININE 0.88 02/19/2015  IVF as above  CMET in am  Hold Ace inhibitors and diuretics    Hypertension BP  173/92   Pulse 95  EKG ST but no other abnormalities Hold Lisinopril  Continue Diltiazem  Add Hydralazine Q6 hours as needed for BP 160/90   Bipolar disorder, and has had some visual hallucinations prior to admission Contiune Lamictal Hold librium, seroquel, geodon as instructed by [facility]    Sharene Butters E, PA-C [facility]   03/14/2017, 9:26 AM

## 2017-03-14 NOTE — ED Provider Notes (Signed)
[facility] EMERGENCY DEPARTMENT Provider Note   CSN: [id number] Arrival date & time: 03/14/17  7616     History   Chief Complaint Chief Complaint  Patient presents with  . Low sodium    HPI Bethany Espinoza is a 51 y.o. female history of COPD, hypertension, alcohol abuse presenting with hyponatremia.  Patient states that she lost her job about 3 weeks ago and has been drinking alcohol daily.  She drinks about a case of Budweiser daily.  She checked into fellowship about 5 days ago and was noted to have a sodium of 130, ammonia level of 100.  Patient was started on Librium taper and lactulose.  Patient has labs checked yesterday and her sodium went down to 119 that resulted this morning so patient was sent here for evaluation.  Patient states that she still has some visual hallucinations currently and sees things and shadows that are not there.  She denies any auditory hallucinations or suicidal or homicidal ideations.  But she states that she still feels confused and very foggy.  Patient denies fevers or chills or vomiting.  Patient is currently on lisinopril for hypertension.   The history is provided by the patient and the EMS personnel.   Level V caveat- AMS   Past Medical History:  Diagnosis Date  . Asthma   . COPD (chronic obstructive pulmonary disease) ([place])   . Fibroids   . Hot flashes 02/27/2015  . Hypertension   . Mental disorder    bipolar, sees Dr Wylene Simmer  . Vaginal dryness 02/27/2015    Patient Active Problem List   Diagnosis Date Noted  . Hot flashes 02/27/2015  . Vaginal dryness 02/27/2015  . Orthostatic hypotension 11/08/2014  . Syncope 11/08/2014  . Reflux esophagitis   . Peptic ulcer disease   . Unintentional weight loss 06/28/2014  . Bipolar 1 disorder ([place]) 01/02/2013  . Hypertension 01/02/2013  . Syncope and collapse 07/28/2011  . Tachycardia 07/28/2011  . Hyponatremia 07/28/2011  . Dehydration 07/28/2011  . Leukocytosis 07/28/2011    . UTI (lower urinary tract infection) 07/28/2011    Past Surgical History:  Procedure Laterality Date  . ABDOMINAL HYSTERECTOMY    . COLONOSCOPY     remote past in [place].   Marland Kitchen COLONOSCOPY N/A 07/20/2014   RMR: Colonic diverticulosis. Single colonic polyp removed as described above  . ESOPHAGOGASTRODUODENOSCOPY N/A 07/20/2014   RMR: Erosive reflux esophagitis. Hiatal hernia. Multiple gastric ulcers and erosinons status post biopsy.    OB History    Gravida Para Term Preterm AB Living   1       1     SAB TAB Ectopic Multiple Live Births   1               Home Medications    Prior to Admission medications   Medication Sig Start Date End Date Taking? Authorizing Provider  chlordiazePOXIDE (LIBRIUM) 5 MG capsule Take 5 mg by mouth 3 (three) times daily as needed for anxiety.   Yes [provider]  cyproheptadine (PERIACTIN) 4 MG tablet Take 4 mg by mouth at bedtime.   Yes [provider]  DiazePAM 10 MG/2ML SOAJ Inject 10 mg into the muscle as needed (SEIZURE).   Yes [provider]  ibuprofen (ADVIL,MOTRIN) 600 MG tablet Take 600 mg by mouth every 6 (six) hours as needed for fever or mild pain.   Yes [provider]  lactulose (CHRONULAC) 10 GM/15ML solution Take 20 g by mouth  3 (three) times daily.   Yes [provider]  Multiple Vitamin (MULTIVITAMIN WITH MINERALS) TABS tablet Take 1 tablet by mouth daily.   Yes [provider]  ondansetron (ZOFRAN) 8 MG tablet Take 8 mg by mouth 2 (two) times daily as needed for nausea or vomiting.   Yes [provider]  thiamine (VITAMIN B-1) 100 MG tablet Take 100 mg by mouth daily.   Yes [provider]  amLODipine (NORVASC) 5 MG tablet Take 1 tablet (5 mg total) by mouth daily. Patient not taking: Reported on 03/14/2017 02/19/15   Elnora Morrison, MD  diltiazem [facility]) 360 MG 24 hr capsule Take 360 mg by mouth daily. 02/26/17   [provider]   hydrochlorothiazide (HYDRODIURIL) 25 MG tablet Take 1 tablet (25 mg total) by mouth daily. Patient not taking: Reported on 03/14/2017 12/01/16   Horton, Barbette Hair, MD  lamoTRIgine (LAMICTAL) 150 MG tablet Take 150 mg by mouth daily.     [provider]  lisinopril (PRINIVIL,ZESTRIL) 20 MG tablet Take 1 tablet (20 mg total) by mouth daily. Patient taking differently: Take 40 mg by mouth daily.  12/01/16   Horton, Barbette Hair, MD  QUEtiapine (SEROQUEL) 25 MG tablet Take 25 mg by mouth at bedtime.    [provider]  ziprasidone (GEODON) 60 MG capsule Take 60 mg by mouth daily.    [provider]    Family History Family History  Problem Relation Age of Onset  . Cancer Father        lung  . Cancer Mother        lymphoma  . Stroke Mother   . Hypertension Mother   . Heart murmur Mother   . Cancer Maternal Grandfather        prostate  . Colon cancer Neg Hx     Social History Social History  Substance Use Topics  . Smoking status: Current Every Day Smoker    Packs/day: 0.50    Years: 29.00    Types: Cigarettes  . Smokeless tobacco: Never Used  . Alcohol use 0.0 oz/week     Comment: social alcohol     Allergies   Lithium and Codeine   Review of Systems Review of Systems  Neurological: Positive for weakness.  Psychiatric/Behavioral: Positive for hallucinations.  All other systems reviewed and are negative.    Physical Exam Updated Vital Signs BP (!) 173/92   Pulse 92   Temp 98.3 F (36.8 C) (Oral)   Resp 16   Ht 5\' 6"  (1.676 m)   Wt 59 kg (130 lb)   SpO2 100%   BMI 20.98 kg/m   Physical Exam  Constitutional:  Dehydrated   HENT:  Head: Normocephalic.  MM slightly dry   Eyes: Pupils are equal, round, and reactive to light. Conjunctivae and EOM are normal.  Neck: Normal range of motion. Neck supple.  Cardiovascular: Normal rate, regular rhythm and normal heart sounds.   Pulmonary/Chest: Effort normal and breath sounds normal. No  respiratory distress. She has no wheezes. She has no rales.  Abdominal: Soft. Bowel sounds are normal. She exhibits no distension. There is no tenderness. There is no guarding.  Musculoskeletal: Normal range of motion.  Neurological: She is alert.  A &O x 2. No obvious tremors or asterixis. Nl strength throughout   Skin: Skin is warm.  Psychiatric: She has a normal mood and affect.  Nursing note and vitals reviewed.    ED Treatments / Results  Labs (all labs  ordered are listed, but only abnormal results are displayed) Labs Reviewed  CBC WITH DIFFERENTIAL/PLATELET - Abnormal; Notable for the following:       Result Value   HCT 33.9 (*)    All other components within normal limits  COMPREHENSIVE METABOLIC PANEL - Abnormal; Notable for the following:    Sodium 119 (*)    Chloride 88 (*)    CO2 21 (*)    Creatinine, Ser 1.13 (*)    Total Bilirubin 0.2 (*)    GFR calc non Af Amer 56 (*)    All other components within normal limits  AMMONIA  URINALYSIS, ROUTINE W REFLEX MICROSCOPIC  OSMOLALITY, URINE  OSMOLALITY  ETHANOL  ETHANOL    EKG  EKG Interpretation  Date/Time:  Sunday March 14 2017 07:33:45 EDT Ventricular Rate:  105 PR Interval:  152 QRS Duration: 86 QT Interval:  354 QTC Calculation: 467 R Axis:   66 Text Interpretation:  Sinus tachycardia Otherwise normal ECG No significant change since last tracing Confirmed by Wandra Arthurs [id number] on 03/14/2017 7:37:45 AM       Radiology No results found.  Procedures Procedures (including critical care time)  CRITICAL CARE Performed by: Wandra Arthurs   Total critical care time: 30 minutes  Critical care time was exclusive of separately billable procedures and treating other patients.  Critical care was necessary to treat or prevent imminent or life-threatening deterioration.  Critical care was time spent personally by me on the following activities: development of treatment plan with patient and/or surrogate as  well as nursing, discussions with consultants, evaluation of patient's response to treatment, examination of patient, obtaining history from patient or surrogate, ordering and performing treatments and interventions, ordering and review of laboratory studies, ordering and review of radiographic studies, pulse oximetry and re-evaluation of patient's condition.   Medications Ordered in ED Medications  sodium chloride 0.9 % bolus 1,000 mL (1,000 mLs Intravenous New Bag/Given 03/14/17 9833)     Initial Impression / Assessment and Plan / ED Course  I have reviewed the triage vital signs and the nursing notes.  Pertinent labs & imaging results that were available during my care of the patient were reviewed by me and considered in my medical decision making (see chart for details).     TYEESHA RIKER is a 51 y.o. female here with hyponatremia. Consider beer potomania but it is unusual since sodium was normal 5 days ago and acutely dropped to 119 from 130. She was started on lisinopril so it may be side effect of medicines. She has no seizure activity and no obvious asterixis. Appears dehydrated so that can contribute to hyponatremia as well. Will repeat CMP, ammonia, urine and serum osms. Will hydrate and reassess.   9:04 AM Repeat Na is 119. Given 1 L NS bolus. Urine and serum osms pending. Will admit for hyponatremia likely multifactorial- beer, lisinopril, dehydration.   Final Clinical Impressions(s) / ED Diagnoses   Final diagnoses:  None    New Prescriptions New Prescriptions   No medications on file     Drenda Freeze, MD 03/14/17 [phone]

## 2017-03-14 NOTE — Progress Notes (Signed)
Pt's BP 198/117. MD R. Nettey. Notified. Will continue to assess

## 2017-03-14 NOTE — ED Notes (Signed)
EDP aware of sodium

## 2017-03-14 NOTE — ED Notes (Signed)
Attempted report 

## 2017-03-14 NOTE — ED Notes (Signed)
Admitting aware of patient bp

## 2017-03-14 NOTE — ED Triage Notes (Signed)
Per [facility]- [facility] for low sodium. Pt states she has not had her blood drawn today/yesterday. Paperwork with pt is from 16th and 17th, including labs EKG. Pt states leg pain bilaterally. Pt also states she fell this morning, denies injury from the fall, denies hitting head.

## 2017-03-14 NOTE — ED Notes (Signed)
Pt Placed on [employer] per [employer], corporate)

## 2017-03-14 NOTE — ED Notes (Signed)
Pt is aware a urine sample is needed. Pt is unable to give a sample at this time.

## 2017-03-14 NOTE — Progress Notes (Signed)
Received report on pt.

## 2017-03-14 NOTE — Progress Notes (Signed)
Bethany Espinoza is a 50 y.o. female patient admitted from ED awake, alert - oriented  X 4 - no acute distress noted.  VSS - Blood pressure (!) 180/95, pulse 95, temperature 98.3 F (36.8 C), temperature source Oral, resp. rate 15, height 5\' 6"  (1.676 m), weight 59 kg (130 lb), SpO2 93 %.    IV in place, occlusive dsg intact without redness.  Orientation to room, and floor completed with information packet given to patient/family.  Patient declined safety video at this time.  Admission INP armband ID verified with patient/family, and in place.   SR up x 2, fall assessment complete, with patient and family able to verbalize understanding of risk associated with falls, and verbalized understanding to call nsg before up out of bed.  Call light within reach, patient able to voice, and demonstrate understanding.  Skin, clean-dry- intact without evidence of bruising, or skin tears. Scab on right shin. No evidence of skin break down noted on exam.     Will cont to eval and treat per MD orders.  Celine Ahr, RN 03/14/2017 4:32 PM

## 2017-03-15 ENCOUNTER — Encounter (HOSPITAL_COMMUNITY): Payer: Self-pay

## 2017-03-15 DIAGNOSIS — N179 Acute kidney failure, unspecified: Secondary | ICD-10-CM

## 2017-03-15 DIAGNOSIS — F1010 Alcohol abuse, uncomplicated: Secondary | ICD-10-CM

## 2017-03-15 DIAGNOSIS — J189 Pneumonia, unspecified organism: Secondary | ICD-10-CM

## 2017-03-15 DIAGNOSIS — E871 Hypo-osmolality and hyponatremia: Principal | ICD-10-CM

## 2017-03-15 LAB — BASIC METABOLIC PANEL
ANION GAP: 8 (ref 5–15)
ANION GAP: 7 (ref 5–15)
Anion gap: 6 (ref 5–15)
Anion gap: 8 (ref 5–15)
BUN: 12 mg/dL (ref 6–20)
BUN: 6 mg/dL (ref 6–20)
BUN: 7 mg/dL (ref 6–20)
BUN: 8 mg/dL (ref 6–20)
CALCIUM: 8.4 mg/dL — AB (ref 8.9–10.3)
CHLORIDE: 98 mmol/L — AB (ref 101–111)
CHLORIDE: 100 mmol/L — AB (ref 101–111)
CHLORIDE: 103 mmol/L (ref 101–111)
CO2: 21 mmol/L — ABNORMAL LOW (ref 22–32)
CO2: 20 mmol/L — AB (ref 22–32)
CO2: 20 mmol/L — ABNORMAL LOW (ref 22–32)
CO2: 20 mmol/L — ABNORMAL LOW (ref 22–32)
CREATININE: 1.11 mg/dL — AB (ref 0.44–1.00)
Calcium: 8.5 mg/dL — ABNORMAL LOW (ref 8.9–10.3)
Calcium: 8.8 mg/dL — ABNORMAL LOW (ref 8.9–10.3)
Calcium: 8.4 mg/dL — ABNORMAL LOW (ref 8.9–10.3)
Chloride: 98 mmol/L — ABNORMAL LOW (ref 101–111)
Creatinine, Ser: 1.11 mg/dL — ABNORMAL HIGH (ref 0.44–1.00)
Creatinine, Ser: 1.04 mg/dL — ABNORMAL HIGH (ref 0.44–1.00)
Creatinine, Ser: 1.33 mg/dL — ABNORMAL HIGH (ref 0.44–1.00)
GFR calc Af Amer: >60 mL/min (ref >60)
GFR calc Af Amer: >60 mL/min (ref >60)
GFR calc Af Amer: 53 mL/min — ABNORMAL LOW (ref >60)
GFR calc non Af Amer: 57 mL/min — ABNORMAL LOW (ref >60)
GFR calc non Af Amer: 57 mL/min — ABNORMAL LOW (ref >60)
GFR, EST NON AFRICAN AMERICAN: 46 mL/min — AB (ref >60)
GLUCOSE: 108 mg/dL — AB (ref 65–99)
GLUCOSE: 62 mg/dL — AB (ref 65–99)
Glucose, Bld: 92 mg/dL (ref 65–99)
Glucose, Bld: 109 mg/dL — ABNORMAL HIGH (ref 65–99)
POTASSIUM: 3.7 mmol/L (ref 3.5–5.1)
POTASSIUM: 3.7 mmol/L (ref 3.5–5.1)
Potassium: 3.8 mmol/L (ref 3.5–5.1)
Potassium: 3.7 mmol/L (ref 3.5–5.1)
SODIUM: 128 mmol/L — AB (ref 135–145)
SODIUM: 130 mmol/L — AB (ref 135–145)
SODIUM: 124 mmol/L — AB (ref 135–145)
Sodium: 127 mmol/L — ABNORMAL LOW (ref 135–145)

## 2017-03-15 LAB — EXPECTORATED SPUTUM ASSESSMENT W GRAM STAIN, RFLX TO RESP C

## 2017-03-15 LAB — CBC
HCT: 30.8 % — ABNORMAL LOW (ref 36.0–46.0)
Hemoglobin: 10.9 g/dL — ABNORMAL LOW (ref 12.0–15.0)
MCH: 30.9 pg (ref 26.0–34.0)
MCHC: 35.4 g/dL (ref 30.0–36.0)
MCV: 87.3 fL (ref 78.0–100.0)
PLATELETS: 223 10*3/uL (ref 150–400)
RBC: 3.53 MIL/uL — AB (ref 3.87–5.11)
RDW: 12.8 % (ref 11.5–15.5)
WBC: 5 10*3/uL (ref 4.0–10.5)

## 2017-03-15 LAB — HIV ANTIBODY (ROUTINE TESTING W REFLEX): HIV Screen 4th Generation wRfx: NONREACTIVE

## 2017-03-15 LAB — PROTIME-INR
INR: 0.97
Prothrombin Time: 12.8 seconds (ref 11.4–15.2)

## 2017-03-15 LAB — EXPECTORATED SPUTUM ASSESSMENT W REFEX TO RESP CULTURE

## 2017-03-15 LAB — CORTISOL: Cortisol, Plasma: 8.4 ug/dL

## 2017-03-15 MED ORDER — DILTIAZEM HCL ER COATED BEADS 180 MG PO CP24
360.0000 mg | ORAL_CAPSULE | Freq: Every day | ORAL | Status: DC
  Administered 2017-03-15 – 2017-03-16 (×2): 360 mg via ORAL
  Filled 2017-03-15 (×3): qty 2

## 2017-03-15 NOTE — Progress Notes (Signed)
PROGRESS NOTE    Bethany Espinoza  AYT:[id number] DOB: Mar 08, 1966 DOA: 03/14/2017 PCP: Sharilyn Sites, MD   Brief Narrative: Bethany Espinoza is a 51 y.o. female with medical history significant for alcohol abuse, hypertension, COPD, bipolar disorder. She presented from [facility] secondary to hyponatremia with a sodium of 119. Asymptomatic. Secondary to beer potomania. Was on lactulose but reports not having diarrhea. Sodium improving with diet and IV fluids.   Assessment & Plan:   Active Problems:   Hyponatremia   Dehydration   Peptic ulcer disease   Asthma   Alcohol abuse   AKI (acute kidney injury) ([place])   Hyponatremia Secondary to beer potomania. No evidence of hypovolemia. Has improved with IV fluids and diet -repeat BMP in AM  CAP -Continue Ceftriaxone/azithromycin  Alcohol abuse Last alcoholic drink was about 6-7 days ago. Well out of the range for withdrawal symptoms -Disontinue CIWA  Essential hypertension Improved today -continue diltiazem and lisinopril   DVT prophylaxis: Lovenox Code Status: Full code Family Communication: None at bedside Disposition Plan: Discharge back to [facility] in 24 hours if sodium improved.   Consultants:   None  Procedures:   None  Antimicrobials:  Ceftriaxone (10/21>>  Azithromycin (10/21>>    Subjective: No headache, confusion, or chest pain.  Objective: Vitals:   03/14/17 1526 03/14/17 1643 03/14/17 2358 03/15/17 0600  BP: (!) 180/95 (!) 189/117 128/61 140/76  Pulse: 95 97 100 90  Resp:  18 16   Temp:  98.8 F (37.1 C) 98.7 F (37.1 C) 98.1 F (36.7 C)  TempSrc:  Oral Oral Oral  SpO2:  100% 98% 99%  Weight:  58.7 kg (129 lb 4.8 oz)  57.2 kg (126 lb)  Height:  5\' 6"  (1.676 m)      Intake/Output Summary (Last 24 hours) at 03/15/17 1102 Last data filed at 03/15/17 0700  Gross per 24 hour  Intake             1562 ml  Output             1000 ml  Net              562 ml   Filed Weights    03/14/17 0722 03/14/17 1643 03/15/17 0600  Weight: 59 kg (130 lb) 58.7 kg (129 lb 4.8 oz) 57.2 kg (126 lb)    Examination:  General exam: Appears calm and comfortable Respiratory system: Clear to auscultation. Respiratory effort normal. Cardiovascular system: S1 & S2 heard, RRR. No murmurs. Gastrointestinal system: Abdomen is nondistended, soft and nontender. Normal bowel sounds heard. Central nervous system: Alert and oriented. No focal neurological deficits. Extremities: No edema. No calf tenderness Skin: No cyanosis. No rashes Psychiatry: Judgement and insight appear normal. Mood & affect appropriate.     Data Reviewed: I have personally reviewed following labs and imaging studies  CBC:  Recent Labs Lab 03/14/17 0738 03/15/17 0004  WBC 5.4 5.0  NEUTROABS 3.5  --   HGB 12.1 10.9*  HCT 33.9* 30.8*  MCV 86.9 87.3  PLT 233 355   Basic Metabolic Panel:  Recent Labs Lab 03/14/17 0738 03/14/17 1253 03/14/17 1950 03/15/17 0004 03/15/17 0718  NA 119* 119* 123* 124* 127*  K 4.3 3.8 3.6 3.8 3.7  CL 88* 93* 96* 98* 98*  CO2 21* 20* 21* 20* 21*  GLUCOSE 95 101* 93 92 108*  BUN 8 6 8 8 6   CREATININE 1.13* 1.18* 1.09* 1.11* 1.11*  CALCIUM 8.9 8.2* 8.9 8.4* 8.5*  GFR: Estimated Creatinine Clearance: 55.4 mL/min (A) (by C-G formula based on SCr of 1.11 mg/dL (H)). Liver Function Tests:  Recent Labs Lab 03/14/17 0738  AST 33  ALT 20  ALKPHOS 100  BILITOT 0.2*  PROT 6.6  ALBUMIN 3.5   No results for input(s): LIPASE, AMYLASE in the last 168 hours.  Recent Labs Lab 03/14/17 0807  AMMONIA 29   Coagulation Profile:  Recent Labs Lab 03/15/17 0004  INR 0.97   Cardiac Enzymes: No results for input(s): CKTOTAL, CKMB, CKMBINDEX, TROPONINI in the last 168 hours. BNP (last 3 results) No results for input(s): PROBNP in the last 8760 hours. HbA1C: No results for input(s): HGBA1C in the last 72 hours. CBG: No results for input(s): GLUCAP in the last 168  hours. Lipid Profile: No results for input(s): CHOL, HDL, LDLCALC, TRIG, CHOLHDL, LDLDIRECT in the last 72 hours. Thyroid Function Tests:  Recent Labs  03/14/17 1950  TSH 3.601   Anemia Panel: No results for input(s): VITAMINB12, FOLATE, FERRITIN, TIBC, IRON, RETICCTPCT in the last 72 hours. Sepsis Labs: No results for input(s): PROCALCITON, LATICACIDVEN in the last 168 hours.  Recent Results (from the past 240 hour(s))  Culture, sputum-assessment     Status: None   Collection Time: 03/15/17  6:30 AM  Result Value Ref Range Status   Specimen Description EXPECTORATED SPUTUM  Final   Special Requests NONE  Final   Sputum evaluation THIS SPECIMEN IS ACCEPTABLE FOR SPUTUM CULTURE  Final   Report Status 03/15/2017 FINAL  Final  Culture, respiratory (NON-Expectorated)     Status: None (Preliminary result)   Collection Time: 03/15/17  6:30 AM  Result Value Ref Range Status   Specimen Description EXPECTORATED SPUTUM  Final   Special Requests NONE Reflexed from [id number]  Final   Gram Stain   Final    RARE WBC PRESENT, PREDOMINANTLY PMN MODERATE GRAM POSITIVE RODS FEW GRAM POSITIVE COCCI RARE GRAM NEGATIVE RODS RARE SQUAMOUS EPITHELIAL CELLS PRESENT    Culture PENDING  Incomplete   Report Status PENDING  Incomplete         Radiology Studies: Dg Chest 2 View  Result Date: 03/14/2017 CLINICAL DATA:  Cough for several weeks. Bilateral leg pain. Fall this morning. EXAM: CHEST  2 VIEW COMPARISON:  February 18, 2005 FINDINGS: Lingular opacity obscures the left heart border. The heart, hila, and mediastinum are otherwise unremarkable. No pneumothorax. No nodules or masses. No other infiltrates. IMPRESSION: Lingular infiltrate.  Recommend follow-up to resolution. Electronically Signed   By: Dorise Bullion III M.D   On: 03/14/2017 13:08        Scheduled Meds: . azithromycin  500 mg Oral Q24H  . diltiazem  360 mg Oral Daily  . enoxaparin (LOVENOX) injection  40 mg Subcutaneous  Q24H  . folic acid  1 mg Oral Daily  . lamoTRIgine  150 mg Oral Daily  . lisinopril  40 mg Oral Daily  . multivitamin with minerals  1 tablet Oral Daily  . thiamine  100 mg Oral Daily   Continuous Infusions: . sodium chloride 100 mL/hr at 03/14/17 1511  . cefTRIAXone (ROCEPHIN)  IV Stopped (03/14/17 1749)     LOS: 1 day     Cordelia Poche, MD [facility] 03/15/2017, 11:02 AM Pager: [phone]  If 7PM-7AM, please contact night-coverage [email or web address] Password [email or web address] 03/15/2017, 11:02 AM

## 2017-03-16 DIAGNOSIS — E860 Dehydration: Secondary | ICD-10-CM

## 2017-03-16 DIAGNOSIS — K279 Peptic ulcer, site unspecified, unspecified as acute or chronic, without hemorrhage or perforation: Secondary | ICD-10-CM

## 2017-03-16 LAB — BASIC METABOLIC PANEL
ANION GAP: 8 (ref 5–15)
ANION GAP: 9 (ref 5–15)
BUN: 8 mg/dL (ref 6–20)
BUN: 5 mg/dL — AB (ref 6–20)
CALCIUM: 8.6 mg/dL — AB (ref 8.9–10.3)
CALCIUM: 8.8 mg/dL — AB (ref 8.9–10.3)
CO2: 18 mmol/L — ABNORMAL LOW (ref 22–32)
CO2: 19 mmol/L — ABNORMAL LOW (ref 22–32)
Chloride: 106 mmol/L (ref 101–111)
Chloride: 107 mmol/L (ref 101–111)
Creatinine, Ser: 1.11 mg/dL — ABNORMAL HIGH (ref 0.44–1.00)
Creatinine, Ser: 1.05 mg/dL — ABNORMAL HIGH (ref 0.44–1.00)
GFR calc Af Amer: >60 mL/min (ref >60)
GFR calc Af Amer: >60 mL/min (ref >60)
GFR, EST NON AFRICAN AMERICAN: 57 mL/min — AB (ref >60)
Glucose, Bld: 94 mg/dL (ref 65–99)
Glucose, Bld: 80 mg/dL (ref 65–99)
POTASSIUM: 3.7 mmol/L (ref 3.5–5.1)
POTASSIUM: 3.8 mmol/L (ref 3.5–5.1)
SODIUM: 132 mmol/L — AB (ref 135–145)
SODIUM: 135 mmol/L (ref 135–145)

## 2017-03-16 MED ORDER — AMOXICILLIN 500 MG PO CAPS: 500.0000 mg | ORAL_CAPSULE | Freq: Three times a day (TID) | ORAL | 0 refills | Status: AC

## 2017-03-16 MED ORDER — AZITHROMYCIN 500 MG PO TABS: 500.0000 mg | ORAL_TABLET | Freq: Every day | ORAL | 0 refills | Status: AC

## 2017-03-16 NOTE — Discharge Summary (Signed)
Physician Discharge Summary  [name]:[id number] DOB: 05-11-1966 DOA: 03/14/2017  PCP: Bethany Sites, MD  Admit date: 03/14/2017 Discharge date: 03/16/2017  [facility] From: [facility] Disposition: [facility]  Recommendations for Outpatient Follow-up:  1. Follow up with PCP in 1 week 2. Please obtain BMP in three days to recheck CO2/bicarb elvel 3. Please follow up on the following pending results: Sputum culture  Home Health: None Equipment/Devices: None  Discharge Condition: Stable CODE STATUS: Full code Diet recommendation: Heart healthy   Brief/Interim Summary:  HPI: Bethany Espinoza is a 51 y.o. female with medical history significant for alcohol abuse, hypertension, COPD, bipolar disorder, presenting for evaluation of hyponatremia.,In review, the patient has lost her job about 3 weeks ago, drinking one case of beer daily. [facility], at the time noting to have a sodium of 130, with ammonia level of 100. The patient was started on Librium taper and lactulose. Labs yesterday, noted her sodium to go down to 119, for which she was sent for evaluation here. She reports feeling very weak, "foggy", and well as reporting visual hallucinations, a wear of the disease. She denies suicidal thoughts. She denies any fevers, chills, nausea or vomiting. She has some loose stools, in the setting of lactulose She denies taking alcohol for the last 5 days. She denies any abdominal pain. She denies any dysuria or gross hematuria. She denies any lower extremity swelling, or calf tenderness. She denies any muscle cramps. She denies any blurred or double vision. She denies any headaches. No seizures reported.office note, she has recently been started on lisinopril revealed for the treatment of blood pressure.   ED Course:  BP (!) 173/92   Pulse 95   Temp 98.3 F (36.8 C) (Oral)   Resp 14   Ht 5\' 6"  (1.676 m)   Wt 59 kg (130 lb)   SpO2 97%    BMI 20.98 kg/m    received 1 L of IV fluids Sodium was 119 on admission, chloride 88, bicarb 21   White count 5.4 . U osmoloality is 85 and Urine sodium is 18 Creatinine 1.13 Total bilirubin 0.2 Ammonia 25  EKG had sinus tachycardia on admission, otherwise normal    Hospital course:  Hyponatremia Secondary to beer potomania. No evidence of hypovolemia. Has improved with IV fluids and diet.  Metabolic acidosis Secondary to kidney injury. Will likely improve over time. Recheck as an outpatient. Patient did not present with acute kidney injury.  CAP Infiltrate of left lingula with productive cough. Started on ceftriaxone and azithromycin, transitioned to amoxicillin and azithromycin on discharge.  Alcohol abuse Last alcoholic drink was about 6-7 days ago. Well out of the range for withdrawal symptoms  Essential hypertension Diltiazem and lisinopril. Discontinued amlodipine and hydrochlorothiazide on discharge.  Discharge Diagnoses:  Active Problems:   Hyponatremia   Dehydration   Peptic ulcer disease   Asthma   Alcohol abuse   AKI (acute kidney injury) [facility])    Discharge Instructions  Discharge Instructions    Increase activity slowly    Complete by:  As directed      Allergies as of 03/16/2017      Reactions   Lithium    Codeine Hives, Rash   Not in right state of mind      Medication List    STOP taking these medications   amLODipine 5 MG tablet Commonly known as:  NORVASC   DiazePAM 10 MG/2ML Soaj   hydrochlorothiazide  25 MG tablet Commonly known as:  HYDRODIURIL   lactulose 10 GM/15ML solution Commonly known as:  CHRONULAC     TAKE these medications   amoxicillin 500 MG capsule Commonly known as:  AMOXIL Take 1 capsule (500 mg total) by mouth 3 (three) times daily.   azithromycin 500 MG tablet Commonly known as:  ZITHROMAX Take 1 tablet (500 mg total) by mouth daily.   cyproheptadine 4 MG tablet Commonly known as:  PERIACTIN Take 4  mg by mouth at bedtime.   diltiazem 360 MG 24 hr capsule Commonly known as:  TIAZAC Take 360 mg by mouth daily.   ibuprofen 600 MG tablet Commonly known as:  ADVIL,MOTRIN Take 600 mg by mouth every 6 (six) hours as needed for fever or mild pain.   LAMICTAL 150 MG tablet Generic drug:  lamoTRIgine Take 150 mg by mouth daily.   lisinopril 20 MG tablet Commonly known as:  PRINIVIL,ZESTRIL Take 1 tablet (20 mg total) by mouth daily. What changed:  how much to take   multivitamin with minerals Tabs tablet Take 1 tablet by mouth daily.   ondansetron 8 MG tablet Commonly known as:  ZOFRAN Take 8 mg by mouth 2 (two) times daily as needed for nausea or vomiting.   QUEtiapine 25 MG tablet Commonly known as:  SEROQUEL Take 25 mg by mouth at bedtime.   thiamine 100 MG tablet Commonly known as:  VITAMIN B-1 Take 100 mg by mouth daily.      Follow-up Information    Bethany Sites, MD. Schedule an appointment as soon as possible for a visit in 1 week(s).   Specialty:  Family Medicine Why:  Recheck metabolic panel Contact information: [place] [address] [phone]          Allergies  Allergen Reactions  . Lithium   . Codeine Hives and Rash    Not in right state of mind    Consultations:  None   Procedures/Studies: Dg Chest 2 View  Result Date: 03/14/2017 CLINICAL DATA:  Cough for several weeks. Bilateral leg pain. Fall this morning. EXAM: CHEST  2 VIEW COMPARISON:  February 18, 2005 FINDINGS: Lingular opacity obscures the left heart border. The heart, hila, and mediastinum are otherwise unremarkable. No pneumothorax. No nodules or masses. No other infiltrates. IMPRESSION: Lingular infiltrate.  Recommend follow-up to resolution. Electronically Signed   By: Dorise Bullion III M.D   On: 03/14/2017 13:08     Subjective: No issues overnight.  Discharge Exam: Vitals:   03/15/17 2152 03/16/17 0537  BP: (!) 150/73 (!) 154/79  Pulse: 91 91   Resp: 17 17  Temp: 99.3 F (37.4 C) 98.4 F (36.9 C)  SpO2: 99% 100%   Vitals:   03/15/17 1229 03/15/17 2152 03/16/17 0536 03/16/17 0537  BP: 126/85 (!) 150/73  (!) 154/79  Pulse: 94 91  91  Resp: 16 17  17   Temp: 98.2 F (36.8 C) 99.3 F (37.4 C)  98.4 F (36.9 C)  TempSrc: Oral Oral  Oral  SpO2:  99%  100%  Weight:   58.3 kg (128 lb 8 oz)   Height:        General: Pt is alert, awake, not in acute distress Cardiovascular: RRR, S1/S2 +, no rubs, no gallops Respiratory: CTA bilaterally, no wheezing, no rhonchi Abdominal: Soft, NT, ND, bowel sounds + Extremities: no edema, no cyanosis    The results of significant diagnostics from this hospitalization (including imaging, microbiology, ancillary and laboratory) are listed  below for reference.     Microbiology: Recent Results (from the past 240 hour(s))  Culture, sputum-assessment     Status: None   Collection Time: 03/15/17  6:30 AM  Result Value Ref Range Status   Specimen Description EXPECTORATED SPUTUM  Final   Special Requests NONE  Final   Sputum evaluation THIS SPECIMEN IS ACCEPTABLE FOR SPUTUM CULTURE  Final   Report Status 03/15/2017 FINAL  Final  Culture, respiratory (NON-Expectorated)     Status: None (Preliminary result)   Collection Time: 03/15/17  6:30 AM  Result Value Ref Range Status   Specimen Description EXPECTORATED SPUTUM  Final   Special Requests NONE Reflexed from [id number]  Final   Gram Stain   Final    RARE WBC PRESENT, PREDOMINANTLY PMN MODERATE GRAM POSITIVE RODS FEW GRAM POSITIVE COCCI RARE GRAM NEGATIVE RODS RARE SQUAMOUS EPITHELIAL CELLS PRESENT    Culture CULTURE REINCUBATED FOR BETTER GROWTH  Final   Report Status PENDING  Incomplete     Labs: BNP (last 3 results) No results for input(s): BNP in the last 8760 hours. Basic Metabolic Panel:  Recent Labs Lab 03/15/17 0718 03/15/17 1256 03/15/17 1942 03/16/17 0204 03/16/17 0707  NA 127* 128* 130* 132* 135  K 3.7 3.7 3.7 3.7  3.8  CL 98* 100* 103 106 107  CO2 21* 20* 20* 18* 19*  GLUCOSE 108* 62* 109* 94 80  BUN 6 7 12 8  5*  CREATININE 1.11* 1.04* 1.33* 1.11* 1.05*  CALCIUM 8.5* 8.8* 8.4* 8.6* 8.8*   Liver Function Tests:  Recent Labs Lab 03/14/17 0738  AST 33  ALT 20  ALKPHOS 100  BILITOT 0.2*  PROT 6.6  ALBUMIN 3.5   No results for input(s): LIPASE, AMYLASE in the last 168 hours.  Recent Labs Lab 03/14/17 0807  AMMONIA 29   CBC:  Recent Labs Lab 03/14/17 0738 03/15/17 0004  WBC 5.4 5.0  NEUTROABS 3.5  --   HGB 12.1 10.9*  HCT 33.9* 30.8*  MCV 86.9 87.3  PLT 233 223   Cardiac Enzymes: No results for input(s): CKTOTAL, CKMB, CKMBINDEX, TROPONINI in the last 168 hours. BNP: Invalid input(s): POCBNP CBG: No results for input(s): GLUCAP in the last 168 hours. D-Dimer No results for input(s): DDIMER in the last 72 hours. Hgb A1c No results for input(s): HGBA1C in the last 72 hours. Lipid Profile No results for input(s): CHOL, HDL, LDLCALC, TRIG, CHOLHDL, LDLDIRECT in the last 72 hours. Thyroid function studies  Recent Labs  03/14/17 1950  TSH 3.601   Anemia work up No results for input(s): VITAMINB12, FOLATE, FERRITIN, TIBC, IRON, RETICCTPCT in the last 72 hours. Urinalysis    Component Value Date/Time   COLORURINE STRAW (A) 03/14/2017 1149   APPEARANCEUR CLEAR 03/14/2017 1149   LABSPEC 1.003 (L) 03/14/2017 1149   PHURINE 7.0 03/14/2017 1149   GLUCOSEU NEGATIVE 03/14/2017 1149   HGBUR NEGATIVE 03/14/2017 1149   BILIRUBINUR NEGATIVE 03/14/2017 1149   KETONESUR NEGATIVE 03/14/2017 1149   PROTEINUR NEGATIVE 03/14/2017 1149   UROBILINOGEN 1.0 07/28/2011 1350   NITRITE NEGATIVE 03/14/2017 1149   LEUKOCYTESUR NEGATIVE 03/14/2017 1149   Sepsis Labs Invalid input(s): PROCALCITONIN,  WBC,  LACTICIDVEN Microbiology Recent Results (from the past 240 hour(s))  Culture, sputum-assessment     Status: None   Collection Time: 03/15/17  6:30 AM  Result Value Ref Range Status    Specimen Description EXPECTORATED SPUTUM  Final   Special Requests NONE  Final   Sputum evaluation THIS SPECIMEN IS ACCEPTABLE  FOR SPUTUM CULTURE  Final   Report Status 03/15/2017 FINAL  Final  Culture, respiratory (NON-Expectorated)     Status: None (Preliminary result)   Collection Time: 03/15/17  6:30 AM  Result Value Ref Range Status   Specimen Description EXPECTORATED SPUTUM  Final   Special Requests NONE Reflexed from [id number]  Final   Gram Stain   Final    RARE WBC PRESENT, PREDOMINANTLY PMN MODERATE GRAM POSITIVE RODS FEW GRAM POSITIVE COCCI RARE GRAM NEGATIVE RODS RARE SQUAMOUS EPITHELIAL CELLS PRESENT    Culture CULTURE REINCUBATED FOR BETTER GROWTH  Final   Report Status PENDING  Incomplete    SIGNED:   Cordelia Poche, MD [facility] 03/16/2017, 10:37 AM Pager [phone]  If 7PM-7AM, please contact night-coverage [email or web address] Password [email or web address]

## 2017-03-16 NOTE — Progress Notes (Signed)
Pt given discharge instructions, prescriptions, and care notes. Pt verbalized understanding AEB no further questions or concerns at this time. IV was discontinued, no redness, pain, or swelling noted at this time. Telemetry discontinued and Centralized Telemetry was notified. Pt left the floor via wheelchair with staff in stable condition and being taken to [facility] via their staff.

## 2017-03-16 NOTE — Discharge Instructions (Signed)
Hyponatremia °Hyponatremia is when the amount of salt (sodium) in your blood is too low. When sodium levels are low, your cells absorb extra water and they swell. The swelling happens throughout the body, but it mostly affects the brain. °What are the causes? °This condition may be caused by: °· Heart, kidney, or liver problems. °· Thyroid problems. °· Adrenal gland problems. °· Metabolic conditions, such as syndrome of inappropriate antidiuretic hormone (SIADH). °· Severe vomiting and diarrhea. °· Certain medicines or illegal drugs. °· Dehydration. °· Drinking too much water. °· Eating a diet that is low in sodium. °· Large burns on your body. °· Sweating. ° °What increases the risk? °This condition is more likely to develop in people who: °· Have long-term (chronic) kidney disease. °· Have heart failure. °· Have a medical condition that causes frequent or excessive diarrhea. °· Have metabolic conditions, such as Addison disease or SIADH. °· Take certain medicines that affect the sodium and fluid balance in the blood. Some of these medicine types include: °? Diuretics. °? NSAIDs. °? Some opioid pain medicines. °? Some antidepressants. °? Some seizure prevention medicines. ° °What are the signs or symptoms? °Symptoms of this condition include: °· Nausea and vomiting. °· Confusion. °· Lethargy. °· Agitation. °· Headache. °· Seizures. °· Unconsciousness. °· Appetite loss. °· Muscle weakness and cramping. °· Feeling weak or light-headed. °· Having a rapid heart rate. °· Fainting, in severe cases. ° °How is this diagnosed? °This condition is diagnosed with a medical history and physical exam. You will also have other tests, including: °· Blood tests. °· Urine tests. ° °How is this treated? °Treatment for this condition depends on the cause. Treatment may include: °· Fluids given through an IV tube that is inserted into one of your veins. °· Medicines to correct the sodium imbalance. If medicines are causing the  condition, the medicines will need to be adjusted. °· Limiting water or fluid intake to get the correct sodium balance. ° °Follow these instructions at home: °· Take medicines only as directed by your health care provider. Many medicines can make this condition worse. Talk with your health care provider about any medicines that you are currently taking. °· Carefully follow a recommended diet as directed by your health care provider. °· Carefully follow instructions from your health care provider about fluid restrictions. °· Keep all follow-up visits as directed by your health care provider. This is important. °· Do not drink alcohol. °Contact a health care provider if: °· You develop worsening nausea, fatigue, headache, confusion, or weakness. °· Your symptoms go away and then return. °· You have problems following the recommended diet. °Get help right away if: °· You have a seizure. °· You faint. °· You have ongoing diarrhea or vomiting. °This information is not intended to replace advice given to you by your health care provider. Make sure you discuss any questions you have with your health care provider. °Document Released: 05/01/2002 Document Revised: 10/17/2015 Document Reviewed: 05/31/2014 °Elsevier Interactive Patient Education © 2018 [employer] ° °

## 2017-03-18 LAB — CULTURE, RESPIRATORY W GRAM STAIN

## 2017-03-18 LAB — CULTURE, RESPIRATORY

## 2020-02-23 DIAGNOSIS — F319 Bipolar disorder, unspecified: Secondary | ICD-10-CM | POA: Diagnosis not present

## 2020-03-16 ENCOUNTER — Other Ambulatory Visit (HOSPITAL_COMMUNITY): Payer: Self-pay | Admitting: Psychiatry

## 2020-05-02 ENCOUNTER — Ambulatory Visit: Payer: 59

## 2020-05-02 DIAGNOSIS — F319 Bipolar disorder, unspecified: Secondary | ICD-10-CM | POA: Diagnosis not present

## 2020-06-12 ENCOUNTER — Other Ambulatory Visit: Payer: Self-pay

## 2020-06-12 ENCOUNTER — Ambulatory Visit
Admission: RE | Admit: 2020-06-12 | Discharge: 2020-06-12 | Disposition: A | Discharge status: Home / Self Care | Payer: BC Managed Care – PPO | Source: Ambulatory Visit | Attending: Emergency Medicine | Admitting: Emergency Medicine

## 2020-06-12 VITALS — BP 153/90 | HR 102 | Temp 98.9°F | Resp 18 | Ht 66.0 in | Wt 136.0 lb

## 2020-06-12 DIAGNOSIS — M545 Low back pain, unspecified: Secondary | ICD-10-CM | POA: Insufficient documentation

## 2020-06-12 LAB — POCT URINALYSIS DIP (MANUAL ENTRY)
Bilirubin, UA: NEGATIVE
Blood, UA: NEGATIVE
Glucose, UA: NEGATIVE mg/dL
Ketones, POC UA: NEGATIVE mg/dL
Nitrite, UA: NEGATIVE
Protein Ur, POC: NEGATIVE mg/dL
Spec Grav, UA: 1.015 (ref 1.010–1.025)
Urobilinogen, UA: 0.2 E.U./dL
pH, UA: 6 (ref 5.0–8.0)

## 2020-06-12 MED ORDER — CYCLOBENZAPRINE HCL 10 MG PO TABS
10.0000 mg | ORAL_TABLET | Freq: Every day | ORAL | 0 refills | Status: DC
Start: 2020-06-12 — End: 2022-07-24

## 2020-06-12 MED ORDER — DEXAMETHASONE SODIUM PHOSPHATE 10 MG/ML IJ SOLN
10.0000 mg | Freq: Once | INTRAMUSCULAR | Status: AC
  Administered 2020-06-12: 10 mg via INTRAMUSCULAR

## 2020-06-12 MED ORDER — PREDNISONE 10 MG PO TABS: 20.0000 mg | ORAL_TABLET | Freq: Every day | ORAL | 0 refills | Status: DC

## 2020-06-12 NOTE — Discharge Instructions (Addendum)
Urine analysis was negative for UTI.  Sample will be sent for culture and someone will call if your result is abnormal. Rest, ice and heat as needed Ensure adequate ROM as tolerated. Take OTC Tylenol 500 mg as needed for pain Prescribed prednisone for inflammation Prescribed flexeril  for muscle spasm.  Do not drive or operate heavy machinery while taking this medication Return here or go to ER if you have any new or worsening symptoms such as numbness/tingling of the inner thighs, loss of bladder or bowel control, headache/blurry vision, nausea/vomiting, confusion/altered mental status, dizziness, weakness, passing out, imbalance, etc..Marland Kitchen

## 2020-06-12 NOTE — ED Triage Notes (Signed)
left sided lower back pain that radiates to groin area since Friday

## 2020-06-12 NOTE — ED Provider Notes (Signed)
Bethany Espinoza   [id number] 06/12/20 Arrival Time: 0903   Chief Complaint  Patient presents with  . Back Pain     SUBJECTIVE: History from: patient.  Bethany Espinoza is a 55 y.o. female presented to the urgent care for complaint of left-sided low back pain that radiated to her groin area for the past 5 days.  Denies any precipitating event.  She localizes the pain to the left low back.  She describes the pain as constant and achy.  She has tried OTC medications without relief.  Her symptoms are made worse with ROM.  She denies similar symptoms in the past.  Denies chills, fever, nausea, vomiting, diarrhea  ROS: As per HPI.  All other pertinent ROS negative.     Past Medical History:  Diagnosis Date  . Asthma   . COPD (chronic obstructive pulmonary disease) ([place])   . Fibroids   . Hot flashes 02/27/2015  . Hypertension   . Mental disorder    bipolar, sees Dr Wylene Simmer  . Vaginal dryness 02/27/2015   Past Surgical History:  Procedure Laterality Date  . ABDOMINAL HYSTERECTOMY    . COLONOSCOPY     remote past in [place].   Marland Kitchen COLONOSCOPY N/A 07/20/2014   RMR: Colonic diverticulosis. Single colonic polyp removed as described above  . ESOPHAGOGASTRODUODENOSCOPY N/A 07/20/2014   RMR: Erosive reflux esophagitis. Hiatal hernia. Multiple gastric ulcers and erosinons status post biopsy.   Allergies  Allergen Reactions  . Lithium   . Codeine Hives and Rash    Not in right state of mind   No current facility-administered medications on file prior to encounter.   Current Outpatient Medications on File Prior to Encounter  Medication Sig Dispense Refill  . cyproheptadine (PERIACTIN) 4 MG tablet Take 4 mg by mouth at bedtime.    Marland Kitchen diltiazem (TIAZAC) 360 MG 24 hr capsule Take 360 mg by mouth daily.  5  . ibuprofen (ADVIL,MOTRIN) 600 MG tablet Take 600 mg by mouth every 6 (six) hours as needed for fever or mild pain.    Marland Kitchen lamoTRIgine (LAMICTAL) 150 MG tablet Take 150 mg by mouth  daily.     Marland Kitchen lisinopril (PRINIVIL,ZESTRIL) 20 MG tablet Take 1 tablet (20 mg total) by mouth daily. (Patient taking differently: Take 40 mg by mouth daily. ) 30 tablet 0  . Multiple Vitamin (MULTIVITAMIN WITH MINERALS) TABS tablet Take 1 tablet by mouth daily.    . ondansetron (ZOFRAN) 8 MG tablet Take 8 mg by mouth 2 (two) times daily as needed for nausea or vomiting.    Marland Kitchen QUEtiapine (SEROQUEL) 25 MG tablet Take 25 mg by mouth at bedtime.    . thiamine (VITAMIN B-1) 100 MG tablet Take 100 mg by mouth daily.     Social History   Socioeconomic History  . Marital status: Single    Spouse name: Not on file  . Number of children: Not on file  . Years of education: Not on file  . Highest education level: Not on file  Occupational History  . Occupation: [employer]  Tobacco Use  . Smoking status: Current Every Day Smoker    Packs/day: 0.50    Years: 29.00    Pack years: 14.50    Types: Cigarettes  . Smokeless tobacco: Never Used  Vaping Use  . Vaping Use: Never used  Substance and Sexual Activity  . Alcohol use: Yes    Alcohol/week: 0.0 standard drinks    Comment: social alcohol  . Drug  use: No  . Sexual activity: Yes    Birth control/protection: Surgical    Comment: hyst  Other Topics Concern  . Not on file  Social History Narrative  . Not on file   Social Determinants of Health   Financial Resource Strain: Not on file  Food Insecurity: Not on file  Transportation Needs: Not on file  Physical Activity: Not on file  Stress: Not on file  Social Connections: Not on file  Intimate Partner Violence: Not on file   Family History  Problem Relation Age of Onset  . Cancer Father        lung  . Cancer Mother        lymphoma  . Stroke Mother   . Hypertension Mother   . Heart murmur Mother   . Cancer Maternal Grandfather        prostate  . Colon cancer Neg Hx     OBJECTIVE:  Vitals:   06/12/20 0935 06/12/20 0936  BP: (!) 153/90   Pulse: (!) 102   Resp: 18    Temp: 98.9 F (37.2 C)   TempSrc: Oral   SpO2: 92%   Weight:  136 lb (61.7 kg)  Height:  5\' 6"  (1.676 m)     Physical Exam Vitals and nursing note reviewed.  Constitutional:      General: She is not in acute distress.    Appearance: Normal appearance. She is normal weight. She is not ill-appearing, toxic-appearing or diaphoretic.  HENT:     Head: Normocephalic.  Cardiovascular:     Rate and Rhythm: Normal rate and regular rhythm.     Pulses: Normal pulses.     Heart sounds: Normal heart sounds. No murmur heard. No friction rub. No gallop.   Pulmonary:     Effort: Pulmonary effort is normal. No respiratory distress.     Breath sounds: Normal breath sounds. No stridor. No wheezing, rhonchi or rales.  Chest:     Chest wall: No tenderness.  Musculoskeletal:        General: Tenderness present.     Lumbar back: Spasms and tenderness present.     Comments: Back:  Patient ambulates from chair to exam table without difficulty.  Inspection: Skin clear and intact without obvious swelling, erythema, or ecchymosis. Warm to the touch  Palpation: Vertebral processes nontender. Tenderness about the lower left paravertebral muscles  ROM: FROM Strength: 5/5 hip flexion, 5/5 knee extension, 5/5 knee flexion, 5/5 plantar flexion, 5/5 dorsiflexion    Neurological:     Mental Status: She is alert and oriented to person, place, and time.     LABS:  Results for orders placed or performed during the hospital encounter of 06/12/20 (from the past 24 hour(s))  POCT urinalysis dipstick     Status: Abnormal   Collection Time: 06/12/20  9:45 AM  Result Value Ref Range   Color, UA yellow yellow   Clarity, UA clear clear   Glucose, UA negative negative mg/dL   Bilirubin, UA negative negative   Ketones, POC UA negative negative mg/dL   Spec Grav, UA 1.015 1.010 - 1.025   Blood, UA negative negative   pH, UA 6.0 5.0 - 8.0   Protein Ur, POC negative negative mg/dL   Urobilinogen, UA 0.2 0.2 or  1.0 E.U./dL   Nitrite, UA Negative Negative   Leukocytes, UA Trace (A) Negative     ASSESSMENT & PLAN:  1. Acute left-sided low back pain without sciatica     Meds ordered  this encounter  Medications  . cyclobenzaprine (FLEXERIL) 10 MG tablet    Sig: Take 1 tablet (10 mg total) by mouth at bedtime.    Dispense:  20 tablet    Refill:  0  . predniSONE (DELTASONE) 10 MG tablet    Sig: Take 2 tablets (20 mg total) by mouth daily.    Dispense:  15 tablet    Refill:  0    Discharge instructions  Urine analysis was negative for UTI.  Sample will be sent for culture and someone will call if your result is abnormal. Rest, ice and heat as needed Ensure adequate ROM as tolerated. Take OTC Tylenol 500 mg as needed for pain Prescribed prednisone for inflammation Prescribed flexeril  for muscle spasm.  Do not drive or operate heavy machinery while taking this medication Return here or go to ER if you have any new or worsening symptoms such as numbness/tingling of the inner thighs, loss of bladder or bowel control, headache/blurry vision, nausea/vomiting, confusion/altered mental status, dizziness, weakness, passing out, imbalance, etc...    Reviewed expectations re: course of current medical issues. Questions answered. Outlined signs and symptoms indicating need for more acute intervention. Patient verbalized understanding. After Visit Summary given.         Emerson Monte, FNP 06/12/20 1043

## 2020-06-14 LAB — URINE CULTURE: Culture: <10000 — AB

## 2020-08-20 ENCOUNTER — Ambulatory Visit
Admission: RE | Admit: 2020-08-20 | Discharge: 2020-08-20 | Disposition: A | Discharge status: Home / Self Care | Payer: BC Managed Care – PPO | Source: Ambulatory Visit | Attending: Family Medicine | Admitting: Family Medicine

## 2020-08-20 ENCOUNTER — Other Ambulatory Visit: Payer: Self-pay

## 2020-08-20 NOTE — ED Provider Notes (Signed)
Patient complains of swelling x 1 week.  Symptoms began after switching from steel toed shoes to tennis shoe.  Complains of associated pain.  Constant and throbbing in character.  Has tried anti-inflammatory without relief.  Diagnosed with gout in past, but states she does not think this is what is causing her symptoms.  Patient requests a fluid pill.  Discussed with patient that I did not feel comfortable prescribing a fluid pill like lasix without blood work and that blood work would take a few days to return.  Offered patient further evaluation and management in the ED for swelling to LEs and calf pain.  Patient declines at this time.  Patient understands and would like to follow up with PCP for further evaluation and management of swelling.  Denies CP, and SOB.     Lestine Box, PA-C 08/20/20 0945

## 2020-08-20 NOTE — ED Triage Notes (Signed)
States both ankles are swollen since last mondays.  States she thinks its fluid because when she pushes on her skin it leaves indentations.

## 2020-08-21 DIAGNOSIS — F319 Bipolar disorder, unspecified: Secondary | ICD-10-CM | POA: Diagnosis not present

## 2020-08-21 DIAGNOSIS — R600 Localized edema: Secondary | ICD-10-CM | POA: Diagnosis not present

## 2020-08-21 DIAGNOSIS — I10 Essential (primary) hypertension: Secondary | ICD-10-CM | POA: Diagnosis not present

## 2020-08-21 DIAGNOSIS — E7849 Other hyperlipidemia: Secondary | ICD-10-CM | POA: Diagnosis not present

## 2020-08-21 DIAGNOSIS — Z1389 Encounter for screening for other disorder: Secondary | ICD-10-CM | POA: Diagnosis not present

## 2020-08-21 DIAGNOSIS — Z6825 Body mass index (BMI) 25.0-25.9, adult: Secondary | ICD-10-CM | POA: Diagnosis not present

## 2020-08-21 DIAGNOSIS — J069 Acute upper respiratory infection, unspecified: Secondary | ICD-10-CM | POA: Diagnosis not present

## 2020-08-21 DIAGNOSIS — R7309 Other abnormal glucose: Secondary | ICD-10-CM | POA: Diagnosis not present

## 2020-08-21 DIAGNOSIS — J4530 Mild persistent asthma, uncomplicated: Secondary | ICD-10-CM | POA: Diagnosis not present

## 2020-08-21 DIAGNOSIS — E559 Vitamin D deficiency, unspecified: Secondary | ICD-10-CM | POA: Diagnosis not present

## 2020-08-30 ENCOUNTER — Other Ambulatory Visit (HOSPITAL_COMMUNITY): Payer: Self-pay | Admitting: Family Medicine

## 2020-08-30 ENCOUNTER — Ambulatory Visit (HOSPITAL_COMMUNITY)
Admission: RE | Admit: 2020-08-30 | Discharge: 2020-08-30 | Disposition: A | Discharge status: Home / Self Care | Payer: BC Managed Care – PPO | Source: Ambulatory Visit | Attending: Family Medicine | Admitting: Family Medicine

## 2020-08-30 ENCOUNTER — Other Ambulatory Visit: Payer: Self-pay

## 2020-08-30 DIAGNOSIS — R062 Wheezing: Secondary | ICD-10-CM | POA: Diagnosis not present

## 2020-08-30 DIAGNOSIS — R059 Cough, unspecified: Secondary | ICD-10-CM

## 2020-10-30 ENCOUNTER — Ambulatory Visit: Payer: BC Managed Care – PPO | Admitting: Internal Medicine

## 2021-01-15 ENCOUNTER — Other Ambulatory Visit (HOSPITAL_COMMUNITY): Payer: Self-pay | Admitting: Family Medicine

## 2021-01-15 ENCOUNTER — Other Ambulatory Visit: Payer: Self-pay | Admitting: Family Medicine

## 2021-01-15 DIAGNOSIS — F17200 Nicotine dependence, unspecified, uncomplicated: Secondary | ICD-10-CM

## 2021-01-15 DIAGNOSIS — R634 Abnormal weight loss: Secondary | ICD-10-CM

## 2021-01-20 ENCOUNTER — Ambulatory Visit: Payer: 59 | Admitting: Podiatrist

## 2021-01-28 ENCOUNTER — Other Ambulatory Visit: Payer: Self-pay

## 2021-01-28 ENCOUNTER — Ambulatory Visit (INDEPENDENT_AMBULATORY_CARE_PROVIDER_SITE_OTHER): Payer: 59 | Admitting: Podiatry

## 2021-01-28 ENCOUNTER — Ambulatory Visit: Payer: 59 | Admitting: Podiatrist

## 2021-01-28 DIAGNOSIS — B351 Tinea unguium: Secondary | ICD-10-CM | POA: Diagnosis not present

## 2021-01-28 DIAGNOSIS — M79609 Pain in unspecified limb: Secondary | ICD-10-CM

## 2021-02-04 NOTE — Progress Notes (Signed)
  Subjective:  Patient ID: Bethany Espinoza, female    DOB: Oct 13, 1965,  MRN: [id number]   55 y.o. female presents with complaint of thickened nails she cannot care for herself. Not diabetic or on a blood thinner.  Objective:  Physical Exam: warm, good capillary refill, nail exam onychomycosis of the toenails, no trophic changes or ulcerative lesions. DP pulses palpable, PT pulses palpable, and protective sensation intact Left Foot: normal exam, no swelling, tenderness, instability; ligaments intact, full range of motion of all ankle/foot joints  Right Foot: normal exam, no swelling, tenderness, instability; ligaments intact, full range of motion of all ankle/foot joints   No images are attached to the encounter.  Assessment:   1. Pain due to onychomycosis of nail    Plan:  Patient was evaluated and treated and all questions answered.  Onychomycosis -Nails palliatively debrided secondary to pain  Procedure: Nail Debridement Type of Debridement: manual, sharp debridement. Instrumentation: Nail nipper, rotary burr. Number of Nails: 10  No follow-ups on file.

## 2021-02-28 ENCOUNTER — Ambulatory Visit (INDEPENDENT_AMBULATORY_CARE_PROVIDER_SITE_OTHER): Payer: 59

## 2021-02-28 ENCOUNTER — Ambulatory Visit (INDEPENDENT_AMBULATORY_CARE_PROVIDER_SITE_OTHER): Payer: 59 | Admitting: Podiatry

## 2021-02-28 ENCOUNTER — Other Ambulatory Visit: Payer: Self-pay

## 2021-02-28 ENCOUNTER — Ambulatory Visit: Payer: 59 | Admitting: Podiatry

## 2021-02-28 ENCOUNTER — Other Ambulatory Visit: Payer: Self-pay | Admitting: Podiatry

## 2021-02-28 DIAGNOSIS — M25571 Pain in right ankle and joints of right foot: Secondary | ICD-10-CM

## 2021-02-28 DIAGNOSIS — M779 Enthesopathy, unspecified: Secondary | ICD-10-CM

## 2021-02-28 DIAGNOSIS — M7750 Other enthesopathy of unspecified foot: Secondary | ICD-10-CM | POA: Diagnosis not present

## 2021-02-28 MED ORDER — METHYLPREDNISOLONE 4 MG PO TBPK: ORAL_TABLET | ORAL | 0 refills | Status: DC

## 2021-03-05 NOTE — Progress Notes (Signed)
Subjective: 55 year old female presents the office today for concerns of bilateral ankle pain.  She states that her entire ankle is sore and significantly worse the last 4 months without any injury.  Pain level is 3/10.  She still hurts to bend her ankles at times.  She is tried Tylenol.  She states that when she last saw Dr. March Rummage she was given an ankle brace which made the pain worse so she stopped wearing this.  No injury that she reports.  She previous had some swelling but that did improve.  She has numbness and tingling has been going on for a long time.  Objective: AAO x3, NAD DP/PT pulses palpable bilaterally, CRT less than 3 seconds There is no area of pinpoint tenderness identified bilateral ankles.  There is global diffuse mild tenderness palpation of the ankle but appears to be more along the course of the posterior tibial, flexor tendons as well as the peroneal tendons.  Mild discomfort of the anterior ankle joint line.  Ankle joint range of motion intact with any crepitation or restriction. No pain with calf compression, swelling, warmth, erythema  Assessment: Bilateral ankles  Plan: -All treatment options discussed with the patient including all alternatives, risks, complications.  -X-rays obtained reviewed.  There is no evidence of acute fracture or stress fracture. -Prescribed Medrol Dosepak.  Referral to physical therapy placed.  Discussed using good arch support. -Patient encouraged to call the office with any questions, concerns, change in symptoms.   Trula Slade DPM

## 2021-12-22 DIAGNOSIS — E559 Vitamin D deficiency, unspecified: Secondary | ICD-10-CM | POA: Diagnosis not present

## 2021-12-22 DIAGNOSIS — I10 Essential (primary) hypertension: Secondary | ICD-10-CM | POA: Diagnosis not present

## 2021-12-22 DIAGNOSIS — R7303 Prediabetes: Secondary | ICD-10-CM | POA: Diagnosis not present

## 2021-12-26 DIAGNOSIS — E538 Deficiency of other specified B group vitamins: Secondary | ICD-10-CM | POA: Diagnosis not present

## 2021-12-26 DIAGNOSIS — R5383 Other fatigue: Secondary | ICD-10-CM | POA: Diagnosis not present

## 2021-12-26 DIAGNOSIS — Z0000 Encounter for general adult medical examination without abnormal findings: Secondary | ICD-10-CM | POA: Diagnosis not present

## 2021-12-26 DIAGNOSIS — Z23 Encounter for immunization: Secondary | ICD-10-CM | POA: Diagnosis not present

## 2021-12-26 DIAGNOSIS — Z1331 Encounter for screening for depression: Secondary | ICD-10-CM | POA: Diagnosis not present

## 2022-05-12 DIAGNOSIS — M25511 Pain in right shoulder: Secondary | ICD-10-CM | POA: Diagnosis not present

## 2022-05-12 DIAGNOSIS — I10 Essential (primary) hypertension: Secondary | ICD-10-CM | POA: Diagnosis not present

## 2022-05-26 DIAGNOSIS — R062 Wheezing: Secondary | ICD-10-CM | POA: Diagnosis not present

## 2022-05-26 DIAGNOSIS — I10 Essential (primary) hypertension: Secondary | ICD-10-CM | POA: Diagnosis not present

## 2022-05-26 DIAGNOSIS — R051 Acute cough: Secondary | ICD-10-CM | POA: Diagnosis not present

## 2022-07-09 DIAGNOSIS — J441 Chronic obstructive pulmonary disease with (acute) exacerbation: Secondary | ICD-10-CM | POA: Diagnosis not present

## 2022-07-09 DIAGNOSIS — R638 Other symptoms and signs concerning food and fluid intake: Secondary | ICD-10-CM | POA: Diagnosis not present

## 2022-07-09 DIAGNOSIS — R051 Acute cough: Secondary | ICD-10-CM | POA: Diagnosis not present

## 2022-07-09 DIAGNOSIS — R0981 Nasal congestion: Secondary | ICD-10-CM | POA: Diagnosis not present

## 2022-07-09 DIAGNOSIS — R5383 Other fatigue: Secondary | ICD-10-CM | POA: Diagnosis not present

## 2022-07-09 DIAGNOSIS — R0902 Hypoxemia: Secondary | ICD-10-CM | POA: Diagnosis not present

## 2022-07-09 DIAGNOSIS — R42 Dizziness and giddiness: Secondary | ICD-10-CM | POA: Diagnosis not present

## 2022-07-09 DIAGNOSIS — Z1152 Encounter for screening for COVID-19: Secondary | ICD-10-CM | POA: Diagnosis not present

## 2022-07-24 ENCOUNTER — Ambulatory Visit (HOSPITAL_BASED_OUTPATIENT_CLINIC_OR_DEPARTMENT_OTHER): Payer: BC Managed Care – PPO | Admitting: Pulmonary Disease

## 2022-07-24 ENCOUNTER — Encounter (HOSPITAL_BASED_OUTPATIENT_CLINIC_OR_DEPARTMENT_OTHER): Payer: Self-pay | Admitting: Pulmonary Disease

## 2022-07-24 VITALS — BP 132/84 | HR 93 | Ht 66.0 in | Wt 146.4 lb

## 2022-07-24 DIAGNOSIS — F17200 Nicotine dependence, unspecified, uncomplicated: Secondary | ICD-10-CM

## 2022-07-24 DIAGNOSIS — R0602 Shortness of breath: Secondary | ICD-10-CM | POA: Diagnosis not present

## 2022-07-24 DIAGNOSIS — Z720 Tobacco use: Secondary | ICD-10-CM | POA: Insufficient documentation

## 2022-07-24 DIAGNOSIS — J439 Emphysema, unspecified: Secondary | ICD-10-CM | POA: Insufficient documentation

## 2022-07-24 DIAGNOSIS — J4489 Other specified chronic obstructive pulmonary disease: Secondary | ICD-10-CM | POA: Diagnosis not present

## 2022-07-24 DIAGNOSIS — J449 Chronic obstructive pulmonary disease, unspecified: Secondary | ICD-10-CM | POA: Insufficient documentation

## 2022-07-24 NOTE — Progress Notes (Signed)
Subjective:    Patient ID: Bethany Espinoza, female    DOB: July 08, 1965, 57 y.o.   MRN: [id number]  HPI   Chief Complaint  Patient presents with   Consult    Pt states she has complaints of SOB that can happen when she is sitting at rest or if she is exerting herself and also has an occasional cough.   57 year old smoker presents for evaluation of dyspnea. She was referred by [facility]. She has bipolar disorder and sees Dr. Toy Care. She smoked about a pack per day for 25 years and more recently has cut down to 6 cigarettes daily. She developed URI symptoms around Christmas, had several sick family contacts, breathing got worse send on initial office visit in January was given doxycycline and a sample of Breztri which she used for about 3 weeks. On a subsequent office visit on 2/15 dyspnea was improved but persistent, this time she was given Z-Pak, prednisone and a cough syrup.  Inhaler was changed to Symbicort prescription.  Oxygen saturation was noted to be as low as 88% but improved with deep breathing.  Chest x-ray showed hyperinflation.  She tested negative for COVID and flu.  She feels improved since that visit and has just transition from [place] to Symbicort.  She states that Judithann Sauger helped her a lot immaturely to see if that Symbicort is helping her similarly  She reports a dry nonproductive cough.  She has to use her albuterol MDI symptoms twice daily while at work.  She works as a Glass blower/designer Oxygen saturation is 93% today  Significant tests/ events reviewed  CT chest with contrast 04/2014 showed mild emphysema    Past Medical History:  Diagnosis Date   Asthma    COPD (chronic obstructive pulmonary disease) ([place])    Fibroids    Hot flashes 02/27/2015   Hypertension    Mental disorder    bipolar, sees Dr Wylene Simmer   Vaginal dryness 02/27/2015   Past Surgical History:  Procedure Laterality Date   ABDOMINAL HYSTERECTOMY     COLONOSCOPY     remote  past in [place].    COLONOSCOPY N/A 07/20/2014   RMR: Colonic diverticulosis. Single colonic polyp removed as described above   ESOPHAGOGASTRODUODENOSCOPY N/A 07/20/2014   RMR: Erosive reflux esophagitis. Hiatal hernia. Multiple gastric ulcers and erosinons status post biopsy.    Allergies  Allergen Reactions   Lithium    Codeine Hives and Rash    Not in right state of mind    Social History   Socioeconomic History   Marital status: Single    Spouse name: Not on file   Number of children: Not on file   Years of education: Not on file   Highest education level: Not on file  Occupational History   Occupation: [employer]  Tobacco Use   Smoking status: Every Day    Packs/day: 0.50    Years: 29.00    Total pack years: 14.50    Types: Cigarettes   Smokeless tobacco: Never  Vaping Use   Vaping Use: Never used  Substance and Sexual Activity   Alcohol use: Yes    Alcohol/week: 0.0 standard drinks of alcohol    Comment: social alcohol   Drug use: No   Sexual activity: Yes    Birth control/protection: Surgical    Comment: hyst  Other Topics Concern   Not on file  Social History Narrative   Not on file   Social Determinants of Health  Financial Resource Strain: Not on file  Food Insecurity: Not on file  Transportation Needs: Not on file  Physical Activity: Not on file  Stress: Not on file  Social Connections: Not on file  Intimate Partner Violence: Not on file    Family History  Problem Relation Age of Onset   Cancer Father        lung   Cancer Mother        lymphoma   Stroke Mother    Hypertension Mother    Heart murmur Mother    Cancer Maternal Grandfather        prostate   Colon cancer Neg Hx       Review of Systems Constitutional: negative for anorexia, fevers and sweats  Eyes: negative for irritation, redness and visual disturbance  Ears, nose, mouth, throat, and face: negative for earaches, epistaxis, nasal congestion and sore throat   Cardiovascular: negative for chest pain,  lower extremity edema, orthopnea, palpitations and syncope  Gastrointestinal: negative for abdominal pain, constipation, diarrhea, melena, nausea and vomiting  Genitourinary:negative for dysuria, frequency and hematuria  Hematologic/lymphatic: negative for bleeding, easy bruising and lymphadenopathy  Musculoskeletal:negative for arthralgias, muscle weakness and stiff joints  Neurological: negative for coordination problems, gait problems, headaches and weakness  Endocrine: negative for diabetic symptoms including polydipsia, polyuria and weight loss     Objective:   Physical Exam   Gen. Pleasant,  in no distress, anxious affect ENT - no pallor,icterus, no post nasal drip, class 2-3 airway Neck: No JVD, no thyromegaly, no carotid bruits Lungs: no use of accessory muscles, no dullness to percussion, decreased without rales or rhonchi  Cardiovascular: Rhythm regular, heart sounds  normal, no murmurs or gallops, no peripheral edema Abdomen: soft and non-tender, no hepatosplenomegaly, BS normal. Musculoskeletal: No deformities, no cyanosis or clubbing Neuro:  alert, non focal, no tremors        Assessment & Plan:

## 2022-07-24 NOTE — Assessment & Plan Note (Signed)
It is very likely that she is slowly recovering from a COPD exacerbation likely due to viral bronchitis.  She has received 2 rounds of antibiotics and a course of prednisone and bronchospasm is resolved. She was just transition from [place] to Symbicort and we will see how she does on this regimen.  We will provide her with a spacer since her MDI technique is poor. Will obtain PFTs to quantitate lung function.  If FEV1 is very low we will ask her to switch back to [place] if this is affordable on her insurance

## 2022-07-24 NOTE — Assessment & Plan Note (Signed)
Smoking cessation was emphasized is the most important intervention that would add years to her life.  She has been able to cut down to 6 cigarettes with Chantix but is not ready to commit to a quit date. We will enroll her in lung cancer screening and use her PFTs and CT scan results to motivate her to quit

## 2022-07-24 NOTE — Patient Instructions (Signed)
X schedule pFTs  Based on this, we will decide whether to step up form symbicort to [employer] on cutting down smoking !  X Lung cancer screening referral

## 2022-08-07 ENCOUNTER — Ambulatory Visit (INDEPENDENT_AMBULATORY_CARE_PROVIDER_SITE_OTHER): Payer: BC Managed Care – PPO | Admitting: Pulmonary Disease

## 2022-08-07 ENCOUNTER — Telehealth (HOSPITAL_BASED_OUTPATIENT_CLINIC_OR_DEPARTMENT_OTHER): Payer: Self-pay | Admitting: Pulmonary Disease

## 2022-08-07 DIAGNOSIS — R0602 Shortness of breath: Secondary | ICD-10-CM | POA: Diagnosis not present

## 2022-08-07 DIAGNOSIS — F17200 Nicotine dependence, unspecified, uncomplicated: Secondary | ICD-10-CM

## 2022-08-07 LAB — PULMONARY FUNCTION TEST
DL/VA % pred: 49 %
DL/VA: 2.07 ml/min/mmHg/L
DLCO cor % pred: 39 %
DLCO cor: 8.36 ml/min/mmHg
DLCO unc % pred: 39 %
DLCO unc: 8.36 ml/min/mmHg
FEF 25-75 Post: 0.75 L/sec
FEF 25-75 Pre: 0.63 L/sec
FEF2575-%Change-Post: 19 %
FEF2575-%Pred-Post: 28 %
FEF2575-%Pred-Pre: 24 %
FEV1-%Change-Post: 7 %
FEV1-%Pred-Post: 51 %
FEV1-%Pred-Pre: 47 %
FEV1-Post: 1.44 L
FEV1-Pre: 1.33 L
FEV1FVC-%Change-Post: 3 %
FEV1FVC-%Pred-Pre: 74 %
FEV6-%Change-Post: 4 %
FEV6-%Pred-Post: 67 %
FEV6-%Pred-Pre: 64 %
FEV6-Post: 2.31 L
FEV6-Pre: 2.22 L
FEV6FVC-%Change-Post: 0 %
FEV6FVC-%Pred-Post: 101 %
FEV6FVC-%Pred-Pre: 102 %
FVC-%Change-Post: 4 %
FVC-%Pred-Post: 66 %
FVC-%Pred-Pre: 63 %
FVC-Post: 2.35 L
FVC-Pre: 2.25 L
Post FEV1/FVC ratio: 61 %
Post FEV6/FVC ratio: 98 %
Pre FEV1/FVC ratio: 59 %
Pre FEV6/FVC Ratio: 99 %
RV % pred: 110 %
RV: 2.14 L
TLC % pred: 90 %
TLC: 4.7 L

## 2022-08-07 NOTE — Progress Notes (Signed)
Full PFT Performed Today  

## 2022-08-07 NOTE — Telephone Encounter (Signed)
We just sent out some information through the mail for the Lung Cancer Screening. We will follow up with patient to schedule the scan

## 2022-08-07 NOTE — Telephone Encounter (Signed)
Pt states prior to her PFT appt today that she hasn't heard from anyone about Aero chamber? AVS states that based on PFT deciding wether to step up form symbicort to [place] assuming this is what pt meant. Also has not heard anything about Lung Cancer Screening with Eric Form. Please advise.   Routing to both [facility] pool and Lung nodule Pool

## 2022-08-07 NOTE — Patient Instructions (Signed)
Full PFT Performed Today  

## 2022-08-12 NOTE — Telephone Encounter (Signed)
Left patient a voicemail today with our call back number for scheduling LDCT

## 2022-08-14 ENCOUNTER — Other Ambulatory Visit: Payer: Self-pay | Admitting: *Deleted

## 2022-08-14 DIAGNOSIS — Z122 Encounter for screening for malignant neoplasm of respiratory organs: Secondary | ICD-10-CM

## 2022-08-14 DIAGNOSIS — F17210 Nicotine dependence, cigarettes, uncomplicated: Secondary | ICD-10-CM

## 2022-08-14 DIAGNOSIS — Z87891 Personal history of nicotine dependence: Secondary | ICD-10-CM

## 2022-09-01 ENCOUNTER — Ambulatory Visit (INDEPENDENT_AMBULATORY_CARE_PROVIDER_SITE_OTHER): Payer: BC Managed Care – PPO | Admitting: Primary Care

## 2022-09-01 DIAGNOSIS — F17200 Nicotine dependence, unspecified, uncomplicated: Secondary | ICD-10-CM

## 2022-09-01 NOTE — Progress Notes (Signed)
Virtual Visit via Telephone Note  I connected with [name] on 09/01/22 at  2:00 PM EDT by telephone and verified that I am speaking with the correct person using two identifiers.  Location: Patient: Home Provider: Office    I discussed the limitations, risks, security and privacy concerns of performing an evaluation and management service by telephone and the availability of in person appointments. I also discussed with the patient that there may be a patient responsible charge related to this service. The patient expressed understanding and agreed to proceed.   Shared Decision Making Visit Lung Cancer Screening Program [phone])   Eligibility: Age 57 y.o. Pack Years Smoking History Calculation 25 (# packs/per year x # years smoked) Recent History of coughing up blood  no Unexplained weight loss? no ( >Than 15 pounds within the last 6 months ) Prior History Lung / other cancer no (Diagnosis within the last 5 years already requiring surveillance chest CT Scans). Smoking Status Current Smoker Former Smokers: Years since quit: < 1 year  Quit Date: NA  Visit Components: Discussion included one or more decision making aids. yes Discussion included risk/benefits of screening. yes Discussion included potential follow up diagnostic testing for abnormal scans. yes Discussion included meaning and risk of over diagnosis. yes Discussion included meaning and risk of False Positives. yes Discussion included meaning of total radiation exposure. yes  Counseling Included: Importance of adherence to annual lung cancer LDCT screening. yes Impact of comorbidities on ability to participate in the program. yes Ability and willingness to under diagnostic treatment. yes  Smoking Cessation Counseling: Current Smokers:  Discussed importance of smoking cessation. yes Information about tobacco cessation classes and interventions provided to patient. yes Patient provided with "ticket" for LDCT  Scan. NA Symptomatic Patient. NA  Counseling(Intermediate counseling: > three minutes) 99406 Diagnosis Code: Tobacco Use Z72.0 Asymptomatic Patient no  Counseling (Intermediate counseling: > three minutes counseling) I0165 Former Smokers:  Discussed the importance of maintaining cigarette abstinence. yes Diagnosis Code: Personal History of Nicotine Dependence. V37.482 Information about tobacco cessation classes and interventions provided to patient. Yes Patient provided with "ticket" for LDCT Scan. NA Written Order for Lung Cancer Screening with LDCT placed in Epic. Yes (CT Chest Lung Cancer Screening Low Dose W/O CM) [id number] Z12.2-Screening of respiratory organs Z87.891-Personal history of nicotine dependence  I have spent 25 minutes of face to face/ virtual visit   time with Ms Bucio discussing the risks and benefits of lung cancer screening. We viewed / discussed a power point together that explained in detail the above noted topics. We paused at intervals to allow for questions to be asked and answered to ensure understanding.We discussed that the single most powerful action that she can take to decrease her risk of developing lung cancer is to quit smoking. We discussed whether or not she is ready to commit to setting a quit date. We discussed options for tools to aid in quitting smoking including nicotine replacement therapy, non-nicotine medications, support groups, Quit Smart classes, and behavior modification. We discussed that often times setting smaller, more achievable goals, such as eliminating 1 cigarette a day for a week and then 2 cigarettes a day for a week can be helpful in slowly decreasing the number of cigarettes smoked. This allows for a sense of accomplishment as well as providing a clinical benefit. I provided her  with smoking cessation  information  with contact information for community resources, classes, free nicotine replacement therapy, and access to mobile apps, text  messaging, and on-line smoking cessation help. I have also provided  her  the office contact information in the event she needs to contact me, or the screening staff. We discussed the time and location of the scan, and that either Abigail Miyamoto RN, Karlton Lemon, RN  or I will call / send a letter with the results within 24-72 hours of receiving them. The patient verbalized understanding of all of  the above and had no further questions upon leaving the office. They have my contact information in the event they have any further questions.  I spent 3-5 minutes counseling on smoking cessation and the health risks of continued tobacco abuse.  I explained to the patient that there has been a high incidence of coronary artery disease noted on these exams. I explained that this is a non-gated exam therefore degree or severity cannot be determined. This patient is not on statin therapy. I have asked the patient to follow-up with their PCP regarding any incidental finding of coronary artery disease and management with diet or medication as their PCP  feels is clinically indicated. The patient verbalized understanding of the above and had no further questions upon completion of the visit.      Glenford Bayley, NP

## 2022-09-01 NOTE — Patient Instructions (Signed)
Thank you for participating in the [facility]. It was our pleasure to meet you today. We will call you with the results of your scan within the next few days. Your scan will be assigned a Lung RADS category score by the physicians reading the scans.  This Lung RADS score determines follow up scanning.  See below for description of categories, and follow up screening recommendations. We will be in touch to schedule your follow up screening annually or based on recommendations of our providers. We will fax a copy of your scan results to your Primary Care Physician, or the physician who referred you to the program, to ensure they have the results. Please call the office if you have any questions or concerns regarding your scanning experience or results.  Our office number is [phone]. Please speak with Denise Phelps, RN. , or  Denise Buckner RN, They are  our Lung Cancer Screening RN.'s If They are unavailable when you call, Please leave a message on the voice mail. We will return your call at our earliest convenience.This voice mail is monitored several times a day.  Remember, if your scan is normal, we will scan you annually as long as you continue to meet the criteria for the program. (Age 50-80, Current smoker or smoker who has quit within the last 15 years). If you are a smoker, remember, quitting is the single most powerful action that you can take to decrease your risk of lung cancer and other pulmonary, breathing related problems. We know quitting is hard, and we are here to help.  Please let us know if there is anything we can do to help you meet your goal of quitting. If you are a former smoker, congratulations. We are proud of you! Remain smoke free! Remember you can refer friends or family members through the number above.  We will screen them to make sure they meet criteria for the program. Thank you for helping us take better care of you by  participating in Lung Screening.  You can receive free nicotine replacement therapy ( patches, gum or mints) by calling 1-800-QUIT NOW. Please call so we can get you on the path to becoming  a non-smoker. I know it is hard, but you can do this!  Lung RADS Categories:  Lung RADS 1: no nodules or definitely non-concerning nodules.  Recommendation is for a repeat annual scan in 12 months.  Lung RADS 2:  nodules that are non-concerning in appearance and behavior with a very low likelihood of becoming an active cancer. Recommendation is for a repeat annual scan in 12 months.  Lung RADS 3: nodules that are probably non-concerning , includes nodules with a low likelihood of becoming an active cancer.  Recommendation is for a 6-month repeat screening scan. Often noted after an upper respiratory illness. We will be in touch to make sure you have no questions, and to schedule your 6-month scan.  Lung RADS 4 A: nodules with concerning findings, recommendation is most often for a follow up scan in 3 months or additional testing based on our provider's assessment of the scan. We will be in touch to make sure you have no questions and to schedule the recommended 3 month follow up scan.  Lung RADS 4 B:  indicates findings that are concerning. We will be in touch with you to schedule additional diagnostic testing based on our provider's  assessment of the scan.  Other options for assistance in smoking cessation (   As covered by your insurance benefits)  Hypnosis for smoking cessation  [employer] [phone]  Acupuncture for smoking cessation  [facility] [phone]   

## 2022-09-04 ENCOUNTER — Encounter (HOSPITAL_COMMUNITY): Payer: Self-pay

## 2022-09-04 ENCOUNTER — Telehealth: Payer: Self-pay | Admitting: Primary Care

## 2022-09-04 ENCOUNTER — Ambulatory Visit (HOSPITAL_COMMUNITY)
Admission: RE | Admit: 2022-09-04 | Discharge: 2022-09-04 | Disposition: A | Discharge status: Home / Self Care | Payer: BC Managed Care – PPO | Source: Ambulatory Visit | Attending: Acute Care | Admitting: Acute Care

## 2022-09-04 DIAGNOSIS — Z122 Encounter for screening for malignant neoplasm of respiratory organs: Secondary | ICD-10-CM | POA: Insufficient documentation

## 2022-09-04 DIAGNOSIS — Z87891 Personal history of nicotine dependence: Secondary | ICD-10-CM

## 2022-09-04 DIAGNOSIS — F17210 Nicotine dependence, cigarettes, uncomplicated: Secondary | ICD-10-CM | POA: Diagnosis not present

## 2022-09-04 MED ORDER — BREZTRI AEROSPHERE 160-9-4.8 MCG/ACT IN AERO
2.0000 | INHALATION_SPRAY | Freq: Two times a day (BID) | RESPIRATORY_TRACT | 5 refills | Status: DC
Start: 2022-09-04 — End: 2022-12-11

## 2022-09-04 NOTE — Telephone Encounter (Signed)
Patient would like results of PFT 08/07/2022. Patient phone number is [phone].

## 2022-09-04 NOTE — Telephone Encounter (Signed)
Bethany Milch, MD 08/07/2022  4:21 PM EDT     Lung function is decreased at 50%. Suggest after she completes Symbicort, she can go back to [place] -2 puffs twice daily if this is covered on her insurance    Called and spoke with pt letting her know the results of PFT and recs per Dr. Vassie Loll and she verbalized understanding. Breztri Rx has been sent to preferred pharmacy for pt. Nothing further needed.

## 2022-09-08 ENCOUNTER — Other Ambulatory Visit: Payer: Self-pay

## 2022-09-08 DIAGNOSIS — Z87891 Personal history of nicotine dependence: Secondary | ICD-10-CM

## 2022-09-08 DIAGNOSIS — Z122 Encounter for screening for malignant neoplasm of respiratory organs: Secondary | ICD-10-CM

## 2022-09-08 DIAGNOSIS — F17210 Nicotine dependence, cigarettes, uncomplicated: Secondary | ICD-10-CM

## 2022-09-23 ENCOUNTER — Ambulatory Visit (INDEPENDENT_AMBULATORY_CARE_PROVIDER_SITE_OTHER): Payer: BC Managed Care – PPO | Admitting: Pulmonary Disease

## 2022-09-23 ENCOUNTER — Encounter: Payer: Self-pay | Admitting: Pulmonary Disease

## 2022-09-23 VITALS — BP 160/84 | HR 90 | Ht 66.0 in | Wt 142.0 lb

## 2022-09-23 DIAGNOSIS — I10 Essential (primary) hypertension: Secondary | ICD-10-CM | POA: Diagnosis not present

## 2022-09-23 DIAGNOSIS — J439 Emphysema, unspecified: Secondary | ICD-10-CM

## 2022-09-23 DIAGNOSIS — J4489 Other specified chronic obstructive pulmonary disease: Secondary | ICD-10-CM | POA: Diagnosis not present

## 2022-09-23 DIAGNOSIS — Z720 Tobacco use: Secondary | ICD-10-CM | POA: Diagnosis not present

## 2022-09-23 MED ORDER — ALBUTEROL SULFATE (2.5 MG/3ML) 0.083% IN NEBU: 2.5000 mg | INHALATION_SOLUTION | Freq: Four times a day (QID) | RESPIRATORY_TRACT | 1 refills | Status: AC | PRN

## 2022-09-23 NOTE — Assessment & Plan Note (Signed)
Smoking cessation was again emphasized is the most important intervention Reports will be provided on Chantix. She would continue annual screening for lung cancer

## 2022-09-23 NOTE — Assessment & Plan Note (Signed)
Moderate to severe degree of obstruction. We will step up from Symbicort to [facility] and provide her refills as needed. Spacer will be provided, MDI technique was reviewed. Will also provide her with a nebulizer and albuterol nebs to use in an emergency I discussed natural history and prognosis of this degree of COPD. We discussed COPD action plan and signs and symptoms of COPD exacerbation

## 2022-09-23 NOTE — Assessment & Plan Note (Signed)
Blood pressure slight high today.  She worked a late last night. She will follow-up with PCP

## 2022-09-23 NOTE — Progress Notes (Signed)
   Subjective:    Patient ID: Bethany Espinoza, female    DOB: [birth year], 57 y.o.   MRN: [id number]  HPI 57 year old smoker   PMH - bipolar disorder and sees Dr. Evelene Croon. She smoked about a pack per day for 25 years and has cut down to 6 cigarettes daily.  Initial OV 07/2022 >>  COPD exacerbation likely due to viral bronchitis. She has received 2 rounds of antibiotics and a course of prednisone  She was switched back to Symbicort by PCP.  We reviewed PFTs and CT chest today. She continues to smoke, she would like a refill on Chantix this has helped decrease the cravings.  She feels like she does not get all of the inhaler and would like a spacer  Significant tests/ events reviewed   CT chest with contrast 04/2014 showed mild emphysema LDCT chest 08/2022 >> RADS 2 S  PFTs 07/2022 >> moderate airway obstruction, ratio 59, improvement 47%, FVC 63%, no bronchodilator response, TLC 90%, DLCO 8.4/39%    Review of Systems neg for any significant sore throat, dysphagia, itching, sneezing, nasal congestion or excess/ purulent secretions, fever, chills, sweats, unintended wt loss, pleuritic or exertional cp, hempoptysis, orthopnea pnd or change in chronic leg swelling. Also denies presyncope, palpitations, heartburn, abdominal pain, nausea, vomiting, diarrhea or change in bowel or urinary habits, dysuria,hematuria, rash, arthralgias, visual complaints, headache, numbness weakness or ataxia.     Objective:   Physical Exam  Gen. Pleasant, , in no distress ENT - no lesions, no post nasal drip Neck: No JVD, no thyromegaly, no carotid bruits Lungs: no use of accessory muscles, no dullness to percussion, decreased without rales or rhonchi  Cardiovascular: Rhythm regular, heart sounds  normal, no murmurs or gallops, no peripheral edema Musculoskeletal: No deformities, no cyanosis or clubbing , no tremors       Assessment & Plan:

## 2022-09-23 NOTE — Patient Instructions (Signed)
X refill on chantix Try to QUIT smoking!  X spacer  X Rx for nebuliser & albuterol nebs q6h prn # 30 x 1 refill

## 2022-10-02 DIAGNOSIS — J449 Chronic obstructive pulmonary disease, unspecified: Secondary | ICD-10-CM | POA: Diagnosis not present

## 2022-12-11 ENCOUNTER — Ambulatory Visit (INDEPENDENT_AMBULATORY_CARE_PROVIDER_SITE_OTHER): Payer: BC Managed Care – PPO | Admitting: Pulmonary Disease

## 2022-12-11 ENCOUNTER — Encounter: Payer: Self-pay | Admitting: Pulmonary Disease

## 2022-12-11 VITALS — BP 158/91 | HR 92 | Ht 66.0 in | Wt 144.0 lb

## 2022-12-11 DIAGNOSIS — Z720 Tobacco use: Secondary | ICD-10-CM

## 2022-12-11 DIAGNOSIS — J4489 Other specified chronic obstructive pulmonary disease: Secondary | ICD-10-CM | POA: Diagnosis not present

## 2022-12-11 DIAGNOSIS — E785 Hyperlipidemia, unspecified: Secondary | ICD-10-CM | POA: Insufficient documentation

## 2022-12-11 DIAGNOSIS — J439 Emphysema, unspecified: Secondary | ICD-10-CM | POA: Diagnosis not present

## 2022-12-11 NOTE — Patient Instructions (Signed)
Continue Audiological scientist on quitting !!

## 2022-12-11 NOTE — Assessment & Plan Note (Signed)
Continue Breztri. Use albuterol for rescue. We discussed COPD action plan and signs and symptoms of COPD exacerbation 

## 2022-12-11 NOTE — Assessment & Plan Note (Signed)
She has quit for 3 weeks but remains at high risk for relapse. She can continue taking Chantix for up to 3 months to be careful about symptoms of depression

## 2022-12-11 NOTE — Progress Notes (Signed)
   Subjective:    Patient ID: Bethany Espinoza, female    DOB: 12-Sep-1965, 57 y.o.   MRN: [id number]  HPI  57 year old smoker for FU of COPD   PMH - bipolar disorder and sees Dr. Evelene Croon. She smoked about a pack per day for 25 years and has cut down to 6 cigarettes daily.   Initial OV 07/2022 >>  COPD exacerbation likely due to viral bronchitis. She received 2 rounds of antibiotics and a course of prednisone   74-month follow-up visit Bethany Espinoza is working well.  She does not meet albuterol nebulizer her last use was a month ago.  She rarely uses albuterol MDI for rescue. She quit smoking 3 weeks ago using Chantix.  She still has cravings and does not feel like this will ever go away. Oxygen saturation varies from 91 to 94%  Blood pressure is slight high today, she denies headache or visual symptoms    Significant tests/ events reviewed   CT chest with contrast 04/2014 showed mild emphysema LDCT chest 08/2022 >> RADS 2 S   PFTs 07/2022 >> moderate airway obstruction, ratio 59, improvement 47%, FVC 63%, no bronchodilator response, TLC 90%, DLCO 8.4/39%  Review of Systems neg for any significant sore throat, dysphagia, itching, sneezing, nasal congestion or excess/ purulent secretions, fever, chills, sweats, unintended wt loss, pleuritic or exertional cp, hempoptysis, orthopnea pnd or change in chronic leg swelling. Also denies presyncope, palpitations, heartburn, abdominal pain, nausea, vomiting, diarrhea or change in bowel or urinary habits, dysuria,hematuria, rash, arthralgias, visual complaints, headache, numbness weakness or ataxia.     Objective:   Physical Exam Gen. Pleasant, well-nourished, in no distress ENT - no thrush, no pallor/icterus,no post nasal drip Neck: No JVD, no thyromegaly, no carotid bruits Lungs: no use of accessory muscles, no dullness to percussion, decreased  without rales or rhonchi  Cardiovascular: Rhythm regular, heart sounds  normal, no murmurs or gallops,  no peripheral edema Musculoskeletal: No deformities, no cyanosis or clubbing         Assessment & Plan:

## 2022-12-31 DIAGNOSIS — E538 Deficiency of other specified B group vitamins: Secondary | ICD-10-CM | POA: Diagnosis not present

## 2023-01-01 DIAGNOSIS — I10 Essential (primary) hypertension: Secondary | ICD-10-CM | POA: Diagnosis not present

## 2023-01-01 DIAGNOSIS — E559 Vitamin D deficiency, unspecified: Secondary | ICD-10-CM | POA: Diagnosis not present

## 2023-01-01 DIAGNOSIS — R7303 Prediabetes: Secondary | ICD-10-CM | POA: Diagnosis not present

## 2023-01-07 DIAGNOSIS — N179 Acute kidney failure, unspecified: Secondary | ICD-10-CM | POA: Diagnosis not present

## 2023-01-07 DIAGNOSIS — Z0000 Encounter for general adult medical examination without abnormal findings: Secondary | ICD-10-CM | POA: Diagnosis not present

## 2023-01-07 DIAGNOSIS — Z1331 Encounter for screening for depression: Secondary | ICD-10-CM | POA: Diagnosis not present

## 2023-01-07 DIAGNOSIS — Z23 Encounter for immunization: Secondary | ICD-10-CM | POA: Diagnosis not present

## 2023-01-07 DIAGNOSIS — Z1339 Encounter for screening examination for other mental health and behavioral disorders: Secondary | ICD-10-CM | POA: Diagnosis not present

## 2023-01-07 DIAGNOSIS — I10 Essential (primary) hypertension: Secondary | ICD-10-CM | POA: Diagnosis not present

## 2023-02-26 ENCOUNTER — Other Ambulatory Visit: Payer: Self-pay | Admitting: Pulmonary Disease

## 2023-02-26 DIAGNOSIS — I10 Essential (primary) hypertension: Secondary | ICD-10-CM | POA: Diagnosis not present

## 2023-02-26 DIAGNOSIS — M25551 Pain in right hip: Secondary | ICD-10-CM | POA: Diagnosis not present

## 2023-03-02 ENCOUNTER — Telehealth: Payer: Self-pay | Admitting: Pulmonary Disease

## 2023-03-02 ENCOUNTER — Other Ambulatory Visit: Payer: Self-pay

## 2023-03-02 NOTE — Telephone Encounter (Signed)
Pt is requesting a refill on this medication she has an up coming appt with Dr Sherene Sires at the [place] office is this okay to refill ?

## 2023-03-02 NOTE — Telephone Encounter (Signed)
Sent message to [facility]  pt to okay refill , someone had taken this medication off her current medication list.

## 2023-03-02 NOTE — Telephone Encounter (Signed)
Patient states needs refill for Breztri. Pharmacy is [facility] [address]. [place]. Patient scheduled 04/30/2023 with Dr. Sherene Sires. Patient phone number is [phone].

## 2023-03-02 NOTE — Telephone Encounter (Signed)
Forwarding

## 2023-03-03 MED ORDER — BREZTRI AEROSPHERE 160-9-4.8 MCG/ACT IN AERO: 2.0000 | INHALATION_SPRAY | Freq: Two times a day (BID) | RESPIRATORY_TRACT | 5 refills | Status: DC

## 2023-03-05 DIAGNOSIS — F3176 Bipolar disorder, in full remission, most recent episode depressed: Secondary | ICD-10-CM | POA: Diagnosis not present

## 2023-03-05 DIAGNOSIS — F3174 Bipolar disorder, in full remission, most recent episode manic: Secondary | ICD-10-CM | POA: Diagnosis not present

## 2023-03-05 NOTE — Telephone Encounter (Signed)
F/u   1. Which medications need to be refilled? (please list name of each medication and dose if known) Budeson-Glycopyrrol-Formoterol (BREZTRI AEROSPHERE) 160-9-4.8 MCG/ACT AERO    2.  Which pharmacy/location (including street and city if local pharmacy) is medication to be sent to? [facility] [address] [place], [place]    3.  Do they need a 30 day or 90 day supply? 90 days supply

## 2023-03-09 NOTE — Telephone Encounter (Signed)
Refilled 03/03/23

## 2023-03-12 ENCOUNTER — Other Ambulatory Visit: Payer: Self-pay

## 2023-03-12 ENCOUNTER — Ambulatory Visit (INDEPENDENT_AMBULATORY_CARE_PROVIDER_SITE_OTHER): Payer: BC Managed Care – PPO | Admitting: Orthopedic Surgery

## 2023-03-12 ENCOUNTER — Other Ambulatory Visit (INDEPENDENT_AMBULATORY_CARE_PROVIDER_SITE_OTHER): Payer: BC Managed Care – PPO

## 2023-03-12 ENCOUNTER — Encounter: Payer: Self-pay | Admitting: Orthopedic Surgery

## 2023-03-12 VITALS — BP 126/81 | HR 101 | Ht 66.0 in | Wt 156.4 lb

## 2023-03-12 DIAGNOSIS — M25552 Pain in left hip: Secondary | ICD-10-CM

## 2023-03-12 DIAGNOSIS — M1611 Unilateral primary osteoarthritis, right hip: Secondary | ICD-10-CM

## 2023-03-12 DIAGNOSIS — G8929 Other chronic pain: Secondary | ICD-10-CM

## 2023-03-12 NOTE — Patient Instructions (Signed)

## 2023-03-12 NOTE — Progress Notes (Signed)
New Patient Visit  Assessment: Bethany Espinoza is a 57 y.o. female with the following: Right hip arthritis Left hip pain   Plan: Bethany Espinoza has pain in both hips, right worse than left.  No specific injuries.  Pain is severe at times and causes her to buckle and almost fall.   Radiographs demonstrate severe right hip OA with some mild changes in her left hip.  She has taken some over the counter medications.  She would like to avoid surgery.  If interested in surgery, she would benefit from hip replacement.  I recommended a steroid injection which was completed in clinic today.   Procedure note injection - Right hip, ultrasound guidance   Verbal consent was obtained to inject the Right hip joint  Timeout was completed to confirm the site of injection.   Using the ultrasound, the femoral neck was identified.  The joint space was also identified. The skin was prepped with alcohol and ethyl chloride was sprayed at the injection site.  A 21-gauge needle was used to inject 40 mg of Depo-Medrol and 1% lidocaine (4 cc) into the hip joint of the Right hip using a direct anterior approach.  The needle was visualized entering the hip joint, and the medication was also visualized. There were no complications.  A sterile bandage was applied.   Note: In order to accurately identify the placement of the needle, ultrasound was required, to increase the accuracy, and specificity of the injection.   Follow-up: No follow-ups on file.  Subjective:  Chief Complaint  Patient presents with   Hip Pain    Bilateral/hurting for about six months now the pain is much worse and I have almost fallen because of it. Right is worse than the left.    History of Present Illness: Bethany Espinoza is a 57 y.o. female who presents for evaluation of bilateral hip pain.  She has had progressively worsening pain in bilateral hips.  Right is worse than left.   Pain is in the groin.  No prior injuries.  She has  been taking OTC medications with limited improvement.  No use of a cane.  No PT.  No injections.  Pain has almost caused her to fall.  She continues to work.  She does not want to consider surgery.   Review of Systems: No fevers or chills No numbness or tingling No chest pain No shortness of breath No bowel or bladder dysfunction No GI distress No headaches   Medical History:  Past Medical History:  Diagnosis Date   Asthma    COPD (chronic obstructive pulmonary disease) (HCC)    Fibroids    Hot flashes 02/27/2015   Hypertension    Mental disorder    bipolar, sees Dr Westley Chandler   Vaginal dryness 02/27/2015    Past Surgical History:  Procedure Laterality Date   ABDOMINAL HYSTERECTOMY     COLONOSCOPY     remote past in [place].    COLONOSCOPY N/A 07/20/2014   RMR: Colonic diverticulosis. Single colonic polyp removed as described above   ESOPHAGOGASTRODUODENOSCOPY N/A 07/20/2014   RMR: Erosive reflux esophagitis. Hiatal hernia. Multiple gastric ulcers and erosinons status post biopsy.    Family History  Problem Relation Age of Onset   Cancer Father        lung   Cancer Mother        lymphoma   Stroke Mother    Hypertension Mother    Heart murmur Mother    Cancer Maternal  Grandfather        prostate   Colon cancer Neg Hx    Social History   Tobacco Use   Smoking status: Former    Current packs/day: 0.00    Average packs/day: 0.5 packs/day for 29.0 years (14.5 ttl pk-yrs)    Types: Cigarettes    Quit date: 11/22/2022    Years since quitting: 0.3   Smokeless tobacco: Never  Vaping Use   Vaping status: Never Used  Substance Use Topics   Alcohol use: Yes    Alcohol/week: 0.0 standard drinks of alcohol    Comment: social alcohol   Drug use: No    Allergies  Allergen Reactions   Lithium    Codeine Hives and Rash    Not in right state of mind    Current Meds  Medication Sig   albuterol (PROVENTIL) (2.5 MG/3ML) 0.083% nebulizer solution Take 3 mLs (2.5 mg  total) by nebulization every 6 (six) hours as needed for wheezing or shortness of breath.   albuterol (VENTOLIN HFA) 108 (90 Base) MCG/ACT inhaler SMARTSIG:1-2 Puff(s) By Mouth Every 4 Hours PRN   Budeson-Glycopyrrol-Formoterol (BREZTRI AEROSPHERE) 160-9-4.8 MCG/ACT AERO Inhale 2 puffs into the lungs in the morning and at bedtime.   lamoTRIgine (LAMICTAL) 150 MG tablet Take 150 mg by mouth daily.    QUEtiapine (SEROQUEL) 25 MG tablet Take 25 mg by mouth at bedtime.   rosuvastatin (CRESTOR) 10 MG tablet Take 10 mg by mouth at bedtime.   ziprasidone (GEODON) 40 MG capsule 1 capsule with food Orally once a day for 30 days    Objective: BP 126/81   Pulse (!) 101   Ht 5\' 6"  (1.676 m)   Wt 156 lb 6 oz (70.9 kg)   BMI 25.24 kg/m   Physical Exam:  General: Alert and oriented. and No acute distress. Gait: Right sided antalgic gait.  Hips without deformity.  No redness.  No swelling.  Pain with internal and external rotation of the right hip.  10 degrees of IR, 30 ER.  Smooth motion of the left hip with minimal pain.  Good lower body strength.  2+ patellar tendon reflexes. Sensation intact distally.   IMAGING: I personally ordered and reviewed the following images  XR of bilateral hips was obtained in clinic today.  No acute injuries.  In the left hip, there is mild loss of joint space with small osteophytes.  There is complete loss of joint space in the right hip with large osteophytes and cystic appearance of the femoral head.  No bony lesions.   Impression: Severe right hip arthritis and mild degenerative changes of the left hip.    New Medications:  No orders of the defined types were placed in this encounter.     Oliver Barre, MD  03/12/2023 11:23 AM

## 2023-04-16 DIAGNOSIS — I10 Essential (primary) hypertension: Secondary | ICD-10-CM | POA: Diagnosis not present

## 2023-04-16 DIAGNOSIS — D519 Vitamin B12 deficiency anemia, unspecified: Secondary | ICD-10-CM | POA: Diagnosis not present

## 2023-04-16 DIAGNOSIS — N179 Acute kidney failure, unspecified: Secondary | ICD-10-CM | POA: Diagnosis not present

## 2023-04-30 ENCOUNTER — Ambulatory Visit: Payer: BC Managed Care – PPO | Admitting: Internal Medicine

## 2023-05-07 ENCOUNTER — Encounter: Payer: Self-pay | Admitting: Orthopedic Surgery

## 2023-05-07 ENCOUNTER — Ambulatory Visit (INDEPENDENT_AMBULATORY_CARE_PROVIDER_SITE_OTHER): Payer: BC Managed Care – PPO | Admitting: Orthopedic Surgery

## 2023-05-07 VITALS — BP 164/84 | HR 90 | Ht 66.0 in | Wt 162.4 lb

## 2023-05-07 DIAGNOSIS — M1611 Unilateral primary osteoarthritis, right hip: Secondary | ICD-10-CM | POA: Diagnosis not present

## 2023-05-07 MED ORDER — TRAMADOL HCL 50 MG PO TABS: 50.0000 mg | ORAL_TABLET | Freq: Two times a day (BID) | ORAL | 0 refills | Status: DC | PRN

## 2023-05-07 NOTE — Progress Notes (Signed)
Return Patient Visit  Assessment: Bethany Espinoza is a 57 y.o. female with the following: Right hip arthritis  Plan: Bethany Espinoza has severe right hip arthritis.  We injected her hip approximately 2 months ago, and she states that she had partial relief for about a week.  She continues to struggle.  We reviewed radiographs today.  I have offered her a right hip replacement.  The procedure was discussed in detail.  All questions have been answered.  She is interested in proceeding with surgery.  Most recent injection was October 18, and we will therefore have to wait at least 3 months early state that she could be scheduled for surgery would be approximately January 18.  She is in agreement with this plan.  I will place a request for time in the operating room.  We will need to obtain medical clearance prior to surgery.  She has help at home.  She is able to stay on main floor early in her recovery.  She may require home health physical therapy.  She states that she has quit smoking.  Risks and benefits of surgery, including, but not limited to infection, bleeding, persistent pain, damage to surrounding structures, need for further surgery, poor healing of the implants, dislocation, blood clots and more severe complications associated with anesthesia were discussed.  All questions have been answered and they have elected to proceed with surgery.     Follow-up: Return for After medical clearance for OR.  Subjective:  Chief Complaint  Patient presents with   Hip Pain    Bilat R > L, pt states the pain has gotten worse since the injection and is causing L hip and bilat knee pain.     History of Present Illness: Bethany Espinoza is a 57 y.o. female who returns for evaluation of right hip pain.  She has severe pain in the right groin.  She occasionally takes tramadol for pain.  I saw her in clinic almost 2 months ago, and I injected her right hip under ultrasound guidance.  She states that  she had some relief of symptoms for about a week.  However, after that, the pain returned.  She stands for long hours at work.  This is likely worsening her pain.  She has pain in her groin.  She notes aching in the right knee.  In addition, she has pain in the left groin, as well as the left knee.  Review of Systems: No fevers or chills No numbness or tingling No chest pain No shortness of breath No bowel or bladder dysfunction No GI distress No headaches      Objective: BP (!) 164/84   Pulse 90   Ht 5\' 6"  (1.676 m)   Wt 162 lb 6.4 oz (73.7 kg)   BMI 26.21 kg/m   Physical Exam:  General: Alert and oriented. and No acute distress. Gait: Right sided antalgic gait.  Right hip without deformity.  No swelling.  5 degrees of internal rotation which is very painful.  15 degrees of external rotation.  Range of motion from 5-85 degrees.  Toes are warm and well-perfused.  2+ DP pulse.   IMAGING: I personally reviewed images previously obtained in clinic  Severe right hip arthritis  New Medications:  Meds ordered this encounter  Medications   traMADol (ULTRAM) 50 MG tablet    Sig: Take 1 tablet (50 mg total) by mouth every 12 (twelve) hours as needed.    Dispense:  30 tablet  Refill:  0      Oliver Barre, MD  05/07/2023 11:50 AM

## 2023-05-31 ENCOUNTER — Ambulatory Visit (HOSPITAL_BASED_OUTPATIENT_CLINIC_OR_DEPARTMENT_OTHER): Payer: BC Managed Care – PPO | Admitting: Pulmonary Disease

## 2023-06-18 DIAGNOSIS — I10 Essential (primary) hypertension: Secondary | ICD-10-CM | POA: Diagnosis not present

## 2023-07-01 ENCOUNTER — Other Ambulatory Visit: Payer: Self-pay | Admitting: Orthopedic Surgery

## 2023-07-02 ENCOUNTER — Ambulatory Visit (INDEPENDENT_AMBULATORY_CARE_PROVIDER_SITE_OTHER): Payer: BC Managed Care – PPO | Admitting: Orthopedic Surgery

## 2023-07-02 ENCOUNTER — Encounter: Payer: Self-pay | Admitting: Orthopedic Surgery

## 2023-07-02 DIAGNOSIS — M25552 Pain in left hip: Secondary | ICD-10-CM | POA: Diagnosis not present

## 2023-07-02 DIAGNOSIS — G8929 Other chronic pain: Secondary | ICD-10-CM | POA: Diagnosis not present

## 2023-07-02 DIAGNOSIS — M1611 Unilateral primary osteoarthritis, right hip: Secondary | ICD-10-CM

## 2023-07-02 NOTE — Progress Notes (Signed)
 Return Patient Visit  Assessment: Bethany Espinoza is a 58 y.o. female with the following: Right hip arthritis Left hip arthritis; mild overall  Plan: Bethany Espinoza has pain in both hips.  I saw her previously for her right hip, and she is interested in total hip arthroplasty.  She will likely wait until later this year, may be September or October.  In the meantime, she is interested in a steroid injection.  She is also having similar type pains in the left hip, would also like to try an injection in her left hip.  Both of these were completed in clinic today.  She will follow-up as needed.  Procedure note injection - Right hip, ultrasound guidance   Verbal consent was obtained to inject the Right hip joint  Timeout was completed to confirm the site of injection.   Using the ultrasound, the femoral neck was identified.  The joint space was also identified. The skin was prepped with alcohol  and ethyl chloride was sprayed at the injection site.  A 21-gauge needle was used to inject 40 mg of Depo-Medrol  and 1% lidocaine  (4 cc) into the hip joint of the Right hip using a direct anterior approach.  The needle was visualized entering the hip joint, and the medication was also visualized. There were no complications.  A sterile bandage was applied.   Note: In order to accurately identify the placement of the needle, ultrasound was required, to increase the accuracy, and specificity of the injection.    Procedure note injection - Left hip, ultrasound guidance   Verbal consent was obtained to inject the Left hip joint  Timeout was completed to confirm the site of injection.   Using the ultrasound, the femoral neck was identified.  The joint space was also identified. The skin was prepped with alcohol  and ethyl chloride was sprayed at the injection site.  A 21-gauge needle was used to inject 40 mg of Depo-Medrol  and 1% lidocaine  (4 cc) into the hip joint of the Left hip using a direct anterior  approach.  The needle was visualized entering the hip joint, and the medication was also visualized. There were no complications.  A sterile bandage was applied.   Note: In order to accurately identify the placement of the needle, ultrasound was required, to increase the accuracy, and specificity of the injection.   Follow-up: Return if symptoms worsen or fail to improve.  Subjective:  Chief Complaint  Patient presents with   Injections    R hip     History of Present Illness: Bethany Espinoza is a 57 y.o. female who returns for evaluation of bilateral Hip pain.  She has known severe right hip arthritis, with mild arthritic changes in her left hip.  She continues to have pain in bilateral groin areas.  She has initiated clearance for surgery on her right hip.  She is interested in an injection, with potential surgery greater than 6 months from now.   Review of Systems: No fevers or chills No numbness or tingling No chest pain No shortness of breath No bowel or bladder dysfunction No GI distress No headaches      Objective: There were no vitals taken for this visit.  Physical Exam:  General: Alert and oriented. and No acute distress. Gait: Right sided antalgic gait.  Right hip without deformity.  No swelling.  5 degrees of internal rotation which is very painful.  15 degrees of external rotation.  Range of motion from 5-85 degrees.  Toes are warm and well-perfused.  2+ DP pulse.  Left hip without deformity.  Pain with internal and external rotation within the groin.  Toes are warm and well-perfused.   IMAGING: I personally reviewed images previously obtained in clinic  Severe right hip arthritis  Mild degenerative changes in her left hip.  New Medications:  No orders of the defined types were placed in this encounter.     Bethany DELENA Horde, MD  07/02/2023 11:59 AM

## 2023-07-02 NOTE — Patient Instructions (Signed)

## 2023-07-20 ENCOUNTER — Telehealth: Payer: Self-pay | Admitting: Internal Medicine

## 2023-07-20 NOTE — Progress Notes (Deleted)
 Bethany Espinoza, female    DOB: 1965-08-03    MRN: [id number]   Brief patient profile:  57  yobf  *** Al[place] pt self  referred back  to pulmonary clinic in [place]  07/22/2023  for GOLD 2 copd    CT chest with contrast 04/2014 showed mild emphysema LDCT chest 08/2022 >> RADS 2 S   PFTs 07/2022 >> FEV1  1.44 (51%) ratio 61  p 7% resp from saba with dlco 39%    History of Present Illness  07/22/2023  Pulmonary/ 1st office eval/ Sherene Sires / [facility]  No chief complaint on file.    Dyspnea:  *** Cough: *** Sleep: *** SABA use: *** 02: *** LDSCT:***  No obvious day to day or daytime pattern/variability or assoc excess/ purulent sputum or mucus plugs or hemoptysis or cp or chest tightness, subjective wheeze or overt sinus or hb symptoms.    Also denies any obvious fluctuation of symptoms with weather or environmental changes or other aggravating or alleviating factors except as outlined above   No unusual exposure hx or h/o childhood pna/ asthma or knowledge of premature birth.  Current Allergies, Complete Past Medical History, Past Surgical History, Family History, and Social History were reviewed in [employer] record.  ROS  The following are not active complaints unless bolded Hoarseness, sore throat, dysphagia, dental problems, itching, sneezing,  nasal congestion or discharge of excess mucus or purulent secretions, ear ache,   fever, chills, sweats, unintended wt loss or wt gain, classically pleuritic or exertional cp,  orthopnea pnd or arm/hand swelling  or leg swelling, presyncope, palpitations, abdominal pain, anorexia, nausea, vomiting, diarrhea  or change in bowel habits or change in bladder habits, change in stools or change in urine, dysuria, hematuria,  rash, arthralgias, visual complaints, headache, numbness, weakness or ataxia or problems with walking or coordination,  change in mood or  memory.            Outpatient Medications Prior to  Visit  Medication Sig Dispense Refill   albuterol (PROVENTIL) (2.5 MG/3ML) 0.083% nebulizer solution Take 3 mLs (2.5 mg total) by nebulization every 6 (six) hours as needed for wheezing or shortness of breath. 120 mL 1   albuterol (VENTOLIN HFA) 108 (90 Base) MCG/ACT inhaler SMARTSIG:1-2 Puff(s) By Mouth Every 4 Hours PRN     Budeson-Glycopyrrol-Formoterol (BREZTRI AEROSPHERE) 160-9-4.8 MCG/ACT AERO Inhale 2 puffs into the lungs in the morning and at bedtime. 10.7 g 5   lamoTRIgine (LAMICTAL) 150 MG tablet Take 150 mg by mouth daily.      QUEtiapine (SEROQUEL) 25 MG tablet Take 25 mg by mouth at bedtime.     rosuvastatin (CRESTOR) 10 MG tablet Take 10 mg by mouth at bedtime.     traMADol (ULTRAM) 50 MG tablet TAKE 1 TABLET BY MOUTH EVERY 12 HOURS AS NEEDED. 14 tablet 2   ziprasidone (GEODON) 40 MG capsule 1 capsule with food Orally once a day for 30 days     No facility-administered medications prior to visit.    Past Medical History:  Diagnosis Date   Asthma    COPD (chronic obstructive pulmonary disease) (HCC)    Fibroids    Hot flashes 02/27/2015   Hypertension    Mental disorder    bipolar, sees Dr Westley Chandler   Vaginal dryness 02/27/2015      Objective:     There were no vitals taken for this visit.         Assessment  No problem-specific Assessment & Plan notes found for this encounter.     Sandrea Hughs, MD 07/20/2023

## 2023-07-20 NOTE — Telephone Encounter (Signed)
 LVM for patient to call and discuss rescheduling the 11:00 am 07/22/23 appointment with Dr. Sherene Sires

## 2023-07-22 ENCOUNTER — Encounter: Payer: Self-pay | Admitting: Internal Medicine

## 2023-07-22 ENCOUNTER — Ambulatory Visit (INDEPENDENT_AMBULATORY_CARE_PROVIDER_SITE_OTHER): Payer: BC Managed Care – PPO | Admitting: Internal Medicine

## 2023-07-22 ENCOUNTER — Ambulatory Visit: Payer: BC Managed Care – PPO | Admitting: Internal Medicine

## 2023-07-22 VITALS — BP 148/82 | HR 90 | Ht 66.0 in | Wt 159.0 lb

## 2023-07-22 DIAGNOSIS — J449 Chronic obstructive pulmonary disease, unspecified: Secondary | ICD-10-CM

## 2023-07-22 MED ORDER — BREZTRI AEROSPHERE 160-9-4.8 MCG/ACT IN AERO
INHALATION_SPRAY | RESPIRATORY_TRACT | 3 refills | Status: AC
Start: 2023-07-22 — End: ?

## 2023-07-22 MED ORDER — BREZTRI AEROSPHERE 160-9-4.8 MCG/ACT IN AERO
2.0000 | INHALATION_SPRAY | Freq: Two times a day (BID) | RESPIRATORY_TRACT | Status: DC
Start: 2023-07-22 — End: 2023-07-22

## 2023-07-22 NOTE — Patient Instructions (Addendum)
 Plan A = Automatic = Always=    Breztri Take 2 puffs first thing  when you wake up  and then another 2 puffs about 12 hours later.    Work on inhaler technique:  relax and gently blow all the way out then take a nice smooth full deep breath back in, triggering the inhaler at same time you start breathing in.  Hold breath in for at least  5 seconds if you can. Blow out breztri  thru nose. Rinse and gargle with water when done.  If mouth or throat bother you at all,  try brushing teeth/gums/tongue with arm and hammer toothpaste/ make a slurry and gargle and spit out.      Plan B = Backup (to supplement plan A, not to replace it) Only use your albuterol inhaler as a rescue medication to be used if you can't catch your breath by resting or doing a relaxed purse lip breathing pattern.  - The less you use it, the better it will work when you need it. - Ok to use the inhaler up to 2 puffs  every 4 hours if you must but call for appointment if use goes up over your usual need - Don't leave home without it !!  (think of it like the spare tire for your car)   Plan C = Crisis (instead of Plan B but only if Plan B stops working) - only use your albuterol nebulizer if you first try Plan B and it fails to help > ok to use the nebulizer up to every 4 hours but if start needing it regularly call for immediate appointment   Please schedule a follow up visit in 3 months but call sooner if needed  with all  inhalers/ solutions/ spacers in hand so we can verify exactly what you are taking. This includes all medications from all doctors and over the counters

## 2023-07-22 NOTE — Progress Notes (Signed)
 Bethany Espinoza, female    DOB: 1965/12/23    MRN: [id number]   Brief patient profile:  5  yobf  quit smokng JUNE 2024  Vassie Loll pt self  referred back  to pulmonary clinic in [place]  07/22/2023  for GOLD 2 copd    CT chest with contrast 04/2014 showed mild emphysema LDCT chest 08/2022 >> RADS 2     PFTs 07/2022 >> FEV1  1.44 (51%) ratio 61  p 7% resp from saba with dlco 39%    History of Present Illness  07/22/2023  Pulmonary/ 1st office eval/ Sherene Sires / [facility] out of [place]  x one month Chief Complaint  Patient presents with   New Patient (Initial Visit)  Dyspnea:  walking across parking lot intentionally / was better on breztri despire poor hfa on today's eval  Cough: none recently  Sleep: bed is flat/ one pillow worse since ran out  SABA use: mostly at work  02: none  LDSCT:every April   No obvious day to day or daytime pattern/variability or assoc excess/ purulent sputum or mucus plugs or hemoptysis or cp or chest tightness, subjective wheeze or overt sinus or hb symptoms.    Also denies any obvious fluctuation of symptoms with weather or environmental changes or other aggravating or alleviating factors except as outlined above   No unusual exposure hx or h/o childhood pna/ asthma or knowledge of premature birth.  Current Allergies, Complete Past Medical History, Past Surgical History, Family History, and Social History were reviewed in [employer] record.  ROS  The following are not active complaints unless bolded Hoarseness, sore throat, dysphagia, dental problems, itching, sneezing,  nasal congestion or discharge of excess mucus or purulent secretions, ear ache,   fever, chills, sweats, unintended wt loss or wt gain, classically pleuritic or exertional cp,  orthopnea pnd or arm/hand swelling  or leg swelling, presyncope, palpitations, abdominal pain, anorexia, nausea, vomiting, diarrhea  or change in bowel habits or change in bladder habits,  change in stools or change in urine, dysuria, hematuria,  rash, arthralgias, visual complaints, headache, numbness, weakness or ataxia or problems with walking or coordination,  change in mood or  memory.            Outpatient Medications Prior to Visit  Medication Sig Dispense Refill   albuterol (PROVENTIL) (2.5 MG/3ML) 0.083% nebulizer solution Take 3 mLs (2.5 mg total) by nebulization every 6 (six) hours as needed for wheezing or shortness of breath. 120 mL 1   albuterol (VENTOLIN HFA) 108 (90 Base) MCG/ACT inhaler SMARTSIG:1-2 Puff(s) By Mouth Every 4 Hours PRN     Budeson-Glycopyrrol-Formoterol (BREZTRI AEROSPHERE) 160-9-4.8 MCG/ACT AERO Inhale 2 puffs into the lungs in the morning and at bedtime. 10.7 g 5   lamoTRIgine (LAMICTAL) 150 MG tablet Take 150 mg by mouth daily.      QUEtiapine (SEROQUEL) 25 MG tablet Take 25 mg by mouth at bedtime.     rosuvastatin (CRESTOR) 10 MG tablet Take 10 mg by mouth at bedtime.     traMADol (ULTRAM) 50 MG tablet TAKE 1 TABLET BY MOUTH EVERY 12 HOURS AS NEEDED. 14 tablet 2   ziprasidone (GEODON) 40 MG capsule 1 capsule with food Orally once a day for 30 days     No facility-administered medications prior to visit.    Past Medical History:  Diagnosis Date   Asthma    COPD (chronic obstructive pulmonary disease) (HCC)    Fibroids    Hot flashes  02/27/2015   Hypertension    Mental disorder    bipolar, sees Dr Westley Chandler   Vaginal dryness 02/27/2015      Objective:     BP (!) 160/91   Pulse 90   Ht 5\' 6"  (1.676 m)   Wt 159 lb (72.1 kg)   SpO2 92% Comment: room air  BMI 25.66 kg/m   SpO2: 92 % (room air)    Amb pleasant bf nad  HEENT : Oropharynx  clear   Nasal turbinates nl    NECK :  without  apparent JVD/ palpable Nodes/TM    LUNGS: no acc muscle use,  Mild barrel  contour chest wall with bilateral  Distant bs s audible wheeze and  without cough on insp or exp maneuvers  and mild  Hyperresonant  to  percussion bilaterally     CV:   RRR  no s3 or murmur or increase in P2, and no edema   ABD:  soft and nontender with pos end  insp Hoover's  in the supine position.  No bruits or organomegaly appreciated   MS:  Nl gait/ ext warm without deformities Or obvious joint restrictions  calf tenderness, cyanosis or clubbing     SKIN: warm and dry without lesions    NEURO:  alert, approp, nl sensorium with  no motor or cerebellar deficits apparent.       I personally reviewed images and agree with radiology impression as follows:   Chest LDSCT        09/04/22  Central airways are patent. Mild amount of debris seen in the right mainstem bronchus. Mild linear opacities of the right middle lobe and lingula, likely due to scarring or atelectasis. Severe centrilobular emphysema. Small solid pulmonary nodules, reference nodule of the right upper lobe measuring 3.1 mm on image 72. Comparison with prior exam is limited given differences in slice thickness.    Assessment   COPD GOLD 2 Quit smoking June 2024  -  Chest LDSCT   09/04/22  severe centrilobular emphysema. - 07/22/2023  After extensive coaching inhaler device,  effectiveness =    60% from a baseline of 30% (short ineffective insp) with hfa >  resume breztri plus approp saba   Group D (now reclassified as E) in terms of symptom/risk and laba/lama/ICS  therefore appropriate rx at this point >>>  breztri still the best choice though may consider stepdown to laba/lama if no exac once off cigs for  a year   Re SABA :  I spent extra time with pt today reviewing appropriate use of albuterol for prn use on exertion with the following points: 1) saba is for relief of sob that does not improve by walking a slower pace or resting but rather if the pt does not improve after trying this first. 2) If the pt is convinced, as many are, that saba helps recover from activity faster then it's easy to tell if this is the case by re-challenging : ie stop, take the inhaler, then p 5 minutes try  the exact same activity (intensity of workload) that just caused the symptoms and see if they are substantially diminished or not after saba 3) if there is an activity that reproducibly causes the symptoms, try the saba 15 min before the activity on alternate days   If in fact the saba really does help, then fine to continue to use it prn but advised may need to look closer at the maintenance regimen being used to achieve  better control of airways disease with exertion.   F/u 3 m with all meds in hand using a trust but verify approach to confirm accurate Medication  Reconciliation The principal here is that until we are certain that the  patients are doing what we've asked, it makes no sense to ask them to do more.          Each maintenance medication was reviewed in detail including emphasizing most importantly the difference between maintenance and prns and under what circumstances the prns are to be triggered using an action plan format where appropriate.  Total time for H and P, chart review, counseling, reviewing hfa/neb  device(s) and generating customized AVS unique to this office visit / same day charting = 35 min new pt eval          Sandrea Hughs, MD 07/22/2023

## 2023-07-22 NOTE — Assessment & Plan Note (Addendum)
 Quit smoking June 2024  -  Chest LDSCT   09/04/22  severe centrilobular emphysema. - 07/22/2023  After extensive coaching inhaler device,  effectiveness =    60% from a baseline of 30% (short ineffective insp) with hfa >  resume breztri plus approp saba   Group D (now reclassified as E) in terms of symptom/risk and laba/lama/ICS  therefore appropriate rx at this point >>>  breztri still the best choice though may consider stepdown to laba/lama if no exac once off cigs for  a year   Re SABA :  I spent extra time with pt today reviewing appropriate use of albuterol for prn use on exertion with the following points: 1) saba is for relief of sob that does not improve by walking a slower pace or resting but rather if the pt does not improve after trying this first. 2) If the pt is convinced, as many are, that saba helps recover from activity faster then it's easy to tell if this is the case by re-challenging : ie stop, take the inhaler, then p 5 minutes try the exact same activity (intensity of workload) that just caused the symptoms and see if they are substantially diminished or not after saba 3) if there is an activity that reproducibly causes the symptoms, try the saba 15 min before the activity on alternate days   If in fact the saba really does help, then fine to continue to use it prn but advised may need to look closer at the maintenance regimen being used to achieve better control of airways disease with exertion.   F/u 3 m with all meds in hand using a trust but verify approach to confirm accurate Medication  Reconciliation The principal here is that until we are certain that the  patients are doing what we've asked, it makes no sense to ask them to do more.          Each maintenance medication was reviewed in detail including emphasizing most importantly the difference between maintenance and prns and under what circumstances the prns are to be triggered using an action plan format where  appropriate.  Total time for H and P, chart review, counseling, reviewing hfa/neb  device(s) and generating customized AVS unique to this office visit / same day charting = 35 min new pt eval

## 2023-07-26 ENCOUNTER — Other Ambulatory Visit (HOSPITAL_COMMUNITY): Payer: Self-pay | Admitting: Nephrology

## 2023-07-26 DIAGNOSIS — N1832 Chronic kidney disease, stage 3b: Secondary | ICD-10-CM | POA: Diagnosis not present

## 2023-07-26 DIAGNOSIS — R7303 Prediabetes: Secondary | ICD-10-CM | POA: Diagnosis not present

## 2023-07-26 DIAGNOSIS — E559 Vitamin D deficiency, unspecified: Secondary | ICD-10-CM | POA: Diagnosis not present

## 2023-07-26 DIAGNOSIS — I129 Hypertensive chronic kidney disease with stage 1 through stage 4 chronic kidney disease, or unspecified chronic kidney disease: Secondary | ICD-10-CM | POA: Diagnosis not present

## 2023-08-06 ENCOUNTER — Ambulatory Visit (HOSPITAL_COMMUNITY)
Admission: RE | Admit: 2023-08-06 | Discharge: 2023-08-06 | Disposition: A | Discharge status: Home / Self Care | Source: Ambulatory Visit | Attending: Nephrology | Admitting: Nephrology

## 2023-08-06 DIAGNOSIS — N1832 Chronic kidney disease, stage 3b: Secondary | ICD-10-CM | POA: Diagnosis not present

## 2023-08-26 ENCOUNTER — Ambulatory Visit: Payer: BC Managed Care – PPO | Admitting: Internal Medicine

## 2023-09-14 ENCOUNTER — Other Ambulatory Visit: Payer: Self-pay | Admitting: Acute Care

## 2023-09-14 DIAGNOSIS — F17210 Nicotine dependence, cigarettes, uncomplicated: Secondary | ICD-10-CM

## 2023-09-14 DIAGNOSIS — Z122 Encounter for screening for malignant neoplasm of respiratory organs: Secondary | ICD-10-CM

## 2023-09-14 DIAGNOSIS — Z87891 Personal history of nicotine dependence: Secondary | ICD-10-CM

## 2023-09-30 ENCOUNTER — Ambulatory Visit (HOSPITAL_COMMUNITY)

## 2023-10-15 ENCOUNTER — Ambulatory Visit (INDEPENDENT_AMBULATORY_CARE_PROVIDER_SITE_OTHER): Admitting: Orthopedic Surgery

## 2023-10-15 ENCOUNTER — Other Ambulatory Visit (INDEPENDENT_AMBULATORY_CARE_PROVIDER_SITE_OTHER): Payer: Self-pay

## 2023-10-15 ENCOUNTER — Encounter: Payer: Self-pay | Admitting: Orthopedic Surgery

## 2023-10-15 DIAGNOSIS — M25551 Pain in right hip: Secondary | ICD-10-CM

## 2023-10-15 DIAGNOSIS — M1611 Unilateral primary osteoarthritis, right hip: Secondary | ICD-10-CM | POA: Diagnosis not present

## 2023-10-15 DIAGNOSIS — G8929 Other chronic pain: Secondary | ICD-10-CM

## 2023-10-15 MED ORDER — TRAMADOL HCL 50 MG PO TABS: 50.0000 mg | ORAL_TABLET | Freq: Three times a day (TID) | ORAL | 0 refills | Status: DC | PRN

## 2023-10-15 NOTE — Patient Instructions (Signed)

## 2023-10-15 NOTE — Progress Notes (Signed)
 Return Patient Visit  Assessment: Bethany Espinoza is a 58 y.o. female with the following: Right hip arthritis Left hip arthritis; mild overall  Plan: Arnell Lange has pain in both hips.  Right is worse than the left.  She has had an acute worsening.  She would like to proceed with another injection.  This was completed today.  After discussing the left hip, we have decided against proceeding with an injection.  I do not want to exacerbate her symptoms or radiographic appearance, which are currently mild.  She is interested in total hip arthroplasty, but would like to wait until the end of the summer.  Procedure note injection - Right hip, ultrasound guidance   Verbal consent was obtained to inject the Right hip joint  Timeout was completed to confirm the site of injection.   Using the ultrasound, the femoral neck was identified.  The joint space was also identified. The skin was prepped with alcohol  and ethyl chloride was sprayed at the injection site.  A 21-gauge needle was used to inject 40 mg of Depo-Medrol  and 1% lidocaine  (4 cc) into the hip joint of the Right hip using a direct anterior approach.  The needle was visualized entering the hip joint, and the medication was also visualized. There were no complications.  A sterile bandage was applied.   Note: In order to accurately identify the placement of the needle, ultrasound was required, to increase the accuracy, and specificity of the injection.       Follow-up: Return if symptoms worsen or fail to improve.  Subjective:  Chief Complaint  Patient presents with   Injections    R hip    Hip Pain    Pt states she stepped wrong on Mother's Day and pain is a little different than before.     History of Present Illness: Bethany Espinoza is a 58 y.o. female who returns for evaluation of bilateral Hip pain.  She has severe right hip arthritis.  She states that she stepped in a hole a few weeks ago, and has had worsening  pain in the right hip.  She states her pain is currently different.  Prior injections have been effective, providing up to 2 months of relief.  Review of Systems: No fevers or chills No numbness or tingling No chest pain No shortness of breath No bowel or bladder dysfunction No GI distress No headaches      Objective: There were no vitals taken for this visit.  Physical Exam:  General: Alert and oriented. and No acute distress. Gait: Right sided antalgic gait.  Right hip without deformity.  No swelling.  5 degrees of internal rotation which is very painful.  15 degrees of external rotation.  Range of motion from 5-85 degrees.  Toes are warm and well-perfused.  2+ DP pulse.  Left hip without deformity.  Pain with internal and external rotation within the groin.  Toes are warm and well-perfused.   IMAGING: I personally reviewed images previously obtained in clinic  Severe right hip arthritis  Mild degenerative changes in her left hip.  New Medications:  No orders of the defined types were placed in this encounter.     Tonita Frater, MD  10/15/2023 11:49 AM

## 2023-10-17 NOTE — Progress Notes (Deleted)
 Bethany Espinoza, female    DOB: [birth year]    MRN: [id number]   Brief patient profile:  32  yobf  quit smokng JUNE 2024  [name] pt self  referred back  to pulmonary clinic in [place]  07/22/2023  for GOLD 2 copd    CT chest with contrast 04/2014 showed mild emphysema LDCT chest 08/2022 >> RADS 2     PFTs 07/2022 >> FEV1  1.44 (51%) ratio 61  p 7% resp from saba with dlco 39%    History of Present Illness  07/22/2023  Pulmonary/ 1st office eval/ [place] / [facility] out of [place]   x one month Chief Complaint  Patient presents with   New Patient (Initial Visit)  Dyspnea:  walking across parking lot intentionally / was better on breztri  despire poor hfa on today's eval  Cough: none recently  Sleep: bed is flat/ one pillow worse since ran out  SABA use: mostly at work  02: none  LDSCT:every April  Rec Plan A = Automatic = Always=    Breztri  Take 2 puffs first thing  when you wake up  and then another 2 puffs about 12 hours later.   Work on inhaler technique:   Plan B = Backup (to supplement plan A, not to replace it) Only use your albuterol  inhaler as a rescue medication  Plan C = Crisis (instead of Plan B but only if Plan B stops working) - only use your albuterol  nebulizer if you first try Plan B   Please schedule a follow up visit in 3 months but call sooner if needed  with all  inhalers/ solutions/ spacers in hand    10/22/2023  f/u ov/[facility] office/[place] re: GOLD 2 COPD  maint on *** did *** bring meds  No chief complaint on file.   Dyspnea:  *** Cough: *** Sleeping: ***   resp cc  SABA use: *** 02: ***  Lung cancer screening: ***   No obvious day to day or daytime variability or assoc excess/ purulent sputum or mucus plugs or hemoptysis or cp or chest tightness, subjective wheeze or overt sinus or hb symptoms.    Also denies any obvious fluctuation of symptoms with weather or environmental changes or other aggravating or alleviating factors except as  outlined above   No unusual exposure hx or h/o childhood pna/ asthma or knowledge of premature birth.  Current Allergies, Complete Past Medical History, Past Surgical History, Family History, and Social History were reviewed in [facility] record.  ROS  The following are not active complaints unless bolded Hoarseness, sore throat, dysphagia, dental problems, itching, sneezing,  nasal congestion or discharge of excess mucus or purulent secretions, ear ache,   fever, chills, sweats, unintended wt loss or wt gain, classically pleuritic or exertional cp,  orthopnea pnd or arm/hand swelling  or leg swelling, presyncope, palpitations, abdominal pain, anorexia, nausea, vomiting, diarrhea  or change in bowel habits or change in bladder habits, change in stools or change in urine, dysuria, hematuria,  rash, arthralgias, visual complaints, headache, numbness, weakness or ataxia or problems with walking or coordination,  change in mood or  memory.        No outpatient medications have been marked as taking for the 10/22/23 encounter (Appointment) with Bethany Ozell NOVAK, MD.           Past Medical History:  Diagnosis Date   Asthma    COPD (chronic obstructive pulmonary disease) (HCC)    Fibroids  Hot flashes 02/27/2015   Hypertension    Mental disorder    bipolar, sees Dr Bethany Espinoza   Vaginal dryness 02/27/2015      Objective:     Vital signs reviewed  10/22/2023  - Note at rest 02 sats  ***% on ***   General appearance:    ***     Mild bar ***       Assessment

## 2023-10-22 ENCOUNTER — Ambulatory Visit: Payer: BC Managed Care – PPO | Admitting: Internal Medicine

## 2023-10-22 DIAGNOSIS — J449 Chronic obstructive pulmonary disease, unspecified: Secondary | ICD-10-CM

## 2023-10-25 NOTE — Progress Notes (Unsigned)
 Bethany Espinoza, female    DOB: February 27, 1966    MRN: [id number]   Brief patient profile:  73  yobf  quit smokng JUNE 2024  [facility] pt self  referred back  to pulmonary clinic in [place]  07/22/2023  for GOLD 2 copd    CT chest with contrast 04/2014 showed mild emphysema LDCT chest 08/2022 >> RADS 2     PFTs 07/2022 >> FEV1  1.44 (51%) ratio 61  p 7% resp from saba with dlco 39%    History of Present Illness  07/22/2023  Pulmonary/ 1st office eval/ Bethany Espinoza / [facility] out of [facility]   x one month Chief Complaint  Patient presents with   New Patient (Initial Visit)  Dyspnea:  walking across parking lot intentionally / was better on breztri  despire poor hfa on today's eval  Cough: none recently  Sleep: bed is flat/ one pillow worse since ran out  SABA use: mostly at work  02: none  LDSCT:every April  Rec Plan A = Automatic = Always=    Breztri  Take 2 puffs first thing  when you wake up  and then another 2 puffs about 12 hours later.   Work on inhaler technique:   Plan Espinoza = Backup (to supplement plan A, not to replace it) Only use your albuterol  inhaler as a rescue medication  Plan C = Crisis (instead of Plan Espinoza but only if Plan Espinoza stops working) - only use your albuterol  nebulizer if you first try Plan Espinoza   Please schedule a follow up visit in 3 months but call sooner if needed  with all  inhalers/ solutions/ spacers in hand    10/29/2023  f/u ov/[place] office/Bethany Espinoza re: GOLD 2 COPD  maint on *** did *** bring meds  No chief complaint on file.   Dyspnea:  *** Cough: *** Sleeping: ***   resp cc  SABA use: *** 02: ***  Lung cancer screening: ***   No obvious day to day or daytime variability or assoc excess/ purulent sputum or mucus plugs or hemoptysis or cp or chest tightness, subjective wheeze or overt sinus or hb symptoms.    Also denies any obvious fluctuation of symptoms with weather or environmental changes or other aggravating or alleviating factors except as outlined  above   No unusual exposure hx or h/o childhood pna/ asthma or knowledge of premature birth.  Current Allergies, Complete Past Medical History, Past Surgical History, Family History, and Social History were reviewed in [employer] record.  ROS  The following are not active complaints unless bolded Hoarseness, sore throat, dysphagia, dental problems, itching, sneezing,  nasal congestion or discharge of excess mucus or purulent secretions, ear ache,   fever, chills, sweats, unintended wt loss or wt gain, classically pleuritic or exertional cp,  orthopnea pnd or arm/hand swelling  or leg swelling, presyncope, palpitations, abdominal pain, anorexia, nausea, vomiting, diarrhea  or change in bowel habits or change in bladder habits, change in stools or change in urine, dysuria, hematuria,  rash, arthralgias, visual complaints, headache, numbness, weakness or ataxia or problems with walking or coordination,  change in mood or  memory.        No outpatient medications have been marked as taking for the 10/29/23 encounter (Appointment) with Bethany Meng B, MD.           Past Medical History:  Diagnosis Date   Asthma    COPD (chronic obstructive pulmonary disease) (HCC)    Fibroids  Hot flashes 02/27/2015   Hypertension    Mental disorder    bipolar, sees Dr Priscilla Brothers   Vaginal dryness 02/27/2015      Objective:     Vital signs reviewed  10/29/2023  - Note at rest 02 sats  ***% on ***   General appearance:    ***     Mild bar ***       Assessment

## 2023-10-29 ENCOUNTER — Telehealth: Payer: Self-pay

## 2023-10-29 ENCOUNTER — Encounter: Payer: Self-pay | Admitting: Internal Medicine

## 2023-10-29 ENCOUNTER — Ambulatory Visit: Admitting: Internal Medicine

## 2023-10-29 VITALS — BP 132/68 | HR 103 | Ht 66.0 in | Wt 144.8 lb

## 2023-10-29 DIAGNOSIS — F17210 Nicotine dependence, cigarettes, uncomplicated: Secondary | ICD-10-CM | POA: Diagnosis not present

## 2023-10-29 DIAGNOSIS — J449 Chronic obstructive pulmonary disease, unspecified: Secondary | ICD-10-CM

## 2023-10-29 MED ORDER — ALBUTEROL SULFATE HFA 108 (90 BASE) MCG/ACT IN AERS: INHALATION_SPRAY | RESPIRATORY_TRACT | 11 refills | Status: AC

## 2023-10-29 NOTE — Telephone Encounter (Signed)
 Left VM for patient to call [phone] to schedule annual LDCT.

## 2023-10-29 NOTE — Patient Instructions (Addendum)
 My office will be contacting you by phone for referral to lung cancer screening   [phone]) - if you don't hear back from my office within one week,  please call us  back or notify us  thru MyChart and we'll address it right away.      Please schedule a follow up visit in 12  months but call sooner if needed

## 2023-10-29 NOTE — Assessment & Plan Note (Addendum)
 Referred for LDSCT 10/29/2023 >>>   Counseled re importance of smoking cessation but did not meet time criteria for separate billing    Low-dose CT lung cancer screening is recommended for patients who are 76-57 years of age with a 20+ pack-year history of smoking and who are currently smoking or quit <=15 years ago. No coughing up blood  No unintentional weight loss of > 15 pounds in the last 6 months - pt is eligible for scanning yearly until 15 years q quits smoking  > referred   Discussed in detail all the  indications, usual  risks and alternatives  relative to the benefits with patient who agrees to proceed with w/u as outlined.             Each maintenance medication was reviewed in detail including emphasizing most importantly the difference between maintenance and prns and under what circumstances the prns are to be triggered using an action plan format where appropriate.  Total time for H and P, chart review, counseling, reviewing hfa/neb device(s) and generating customized AVS unique to this office visit / same day charting = 30 min

## 2023-10-29 NOTE — Assessment & Plan Note (Signed)
 Active smoker -  Chest LDSCT   09/04/22  severe centrilobular emphysema. - 07/22/2023  After extensive coaching inhaler device,  effectiveness =    60% from a baseline of 30% (short ineffective insp) with hfa >  resume breztri  plus approp saba   Group D (now reclassified as E) in terms of symptom/risk and laba/lama/ICS  therefore appropriate rx at this point >>>  breztri  and approp saba:  Re SABA :  I spent extra time with pt today reviewing appropriate use of albuterol  for prn use on exertion with the following points: 1) saba is for relief of sob that does not improve by walking a slower pace or resting but rather if the pt does not improve after trying this first. 2) If the pt is convinced, as many are, that saba helps recover from activity faster then it's easy to tell if this is the case by re-challenging : ie stop, take the inhaler, then p 5 minutes try the exact same activity (intensity of workload) that just caused the symptoms and see if they are substantially diminished or not after saba 3) if there is an activity that reproducibly causes the symptoms, try the saba 15 min before the activity on alternate days   If in fact the saba really does help, then fine to continue to use it prn but advised may need to look closer at the maintenance regimen being used to achieve better control of airways disease with exertion.   F/u q 12 m, sooner if needed

## 2023-11-04 ENCOUNTER — Encounter: Payer: Self-pay | Admitting: Cardiology

## 2023-11-04 DIAGNOSIS — I351 Nonrheumatic aortic (valve) insufficiency: Secondary | ICD-10-CM | POA: Insufficient documentation

## 2023-11-04 DIAGNOSIS — R931 Abnormal findings on diagnostic imaging of heart and coronary circulation: Secondary | ICD-10-CM | POA: Insufficient documentation

## 2023-11-04 DIAGNOSIS — D219 Benign neoplasm of connective and other soft tissue, unspecified: Secondary | ICD-10-CM | POA: Insufficient documentation

## 2023-11-04 NOTE — Progress Notes (Deleted)
 Cardiology Office Note   Date:  11/04/2023   ID:  Bethany Espinoza, DOB May 10, 1966, MRN [id number]  PCP:  Azalia Leo, MD  Cardiologist:   None Referring:  ***  No chief complaint on file.     History of Present Illness: Bethany Espinoza is a 58 y.o. female who presents for evaluation of elevated coronary calcium.      Echo in 2016 demonstrated an EF of 60 -65%.  She had mild AI.  ***   Past Medical History:  Diagnosis Date   Asthma    COPD (chronic obstructive pulmonary disease) (HCC)    Fibroids    Hot flashes 02/27/2015   Hypertension    Mental disorder    bipolar, sees Dr Priscilla Brothers   Vaginal dryness 02/27/2015    Past Surgical History:  Procedure Laterality Date   ABDOMINAL HYSTERECTOMY     COLONOSCOPY     remote past in [place].    COLONOSCOPY N/A 07/20/2014   RMR: Colonic diverticulosis. Single colonic polyp removed as described above   ESOPHAGOGASTRODUODENOSCOPY N/A 07/20/2014   RMR: Erosive reflux esophagitis. Hiatal hernia. Multiple gastric ulcers and erosinons status post biopsy.     Current Outpatient Medications  Medication Sig Dispense Refill   albuterol  (PROVENTIL ) (2.5 MG/3ML) 0.083% nebulizer solution Take 3 mLs (2.5 mg total) by nebulization every 6 (six) hours as needed for wheezing or shortness of breath. 120 mL 1   albuterol  (VENTOLIN  HFA) 108 (90 Base) MCG/ACT inhaler 2 puffs up to every 4 hours as needed if you can't catch your breath 18 g 11   amLODipine  (NORVASC ) 5 MG tablet Take 5 mg by mouth daily.     Budeson-Glycopyrrol-Formoterol (BREZTRI  AEROSPHERE) 160-9-4.8 MCG/ACT AERO Take 2 puffs first thing in am and then another 2 puffs about 12 hours later. 32.1 g 3   lamoTRIgine  (LAMICTAL ) 150 MG tablet Take 150 mg by mouth daily.      QUEtiapine  (SEROQUEL ) 25 MG tablet Take 25 mg by mouth at bedtime.     rosuvastatin (CRESTOR) 10 MG tablet Take 10 mg by mouth at bedtime.     traMADol  (ULTRAM ) 50 MG tablet Take 1 tablet (50 mg total) by  mouth every 8 (eight) hours as needed. 30 tablet 0   ziprasidone  (GEODON ) 40 MG capsule 1 capsule with food Orally once a day for 30 days     No current facility-administered medications for this visit.    Allergies:   Lithium and Codeine    Social History:  The patient  reports that she has been smoking cigarettes. She has a 14.5 pack-year smoking history. She has never used smokeless tobacco. She reports current alcohol  use. She reports that she does not use drugs.   Family History:  The patient's ***family history includes Cancer in her father, maternal grandfather, and mother; Heart murmur in her mother; Hypertension in her mother; Stroke in her mother.    ROS:  Please see the history of present illness.   Otherwise, review of systems are positive for {NONE DEFAULTED:18576}.   All other systems are reviewed and negative.    PHYSICAL EXAM: VS:  There were no vitals taken for this visit. , BMI There is no height or weight on file to calculate BMI. GENERAL:  Well appearing HEENT:  Pupils equal round and reactive, fundi not visualized, oral mucosa unremarkable NECK:  No jugular venous distention, waveform within normal limits, carotid upstroke brisk and symmetric, no bruits, no thyromegaly LYMPHATICS:  No  cervical, inguinal adenopathy LUNGS:  Clear to auscultation bilaterally BACK:  No CVA tenderness CHEST:  Unremarkable HEART:  PMI not displaced or sustained,S1 and S2 within normal limits, no S3, no S4, no clicks, no rubs, *** murmurs ABD:  Flat, positive bowel sounds normal in frequency in pitch, no bruits, no rebound, no guarding, no midline pulsatile mass, no hepatomegaly, no splenomegaly EXT:  2 plus pulses throughout, no edema, no cyanosis no clubbing SKIN:  No rashes no nodules NEURO:  Cranial nerves II through XII grossly intact, motor grossly intact throughout PSYCH:  Cognitively intact, oriented to person place and time    EKG:        Recent Labs: No results found  for requested labs within last 365 days.    Lipid Panel No results found for: CHOL, TRIG, HDL, CHOLHDL, VLDL, LDLCALC, LDLDIRECT    Wt Readings from Last 3 Encounters:  10/29/23 144 lb 12.8 oz (65.7 kg)  07/22/23 159 lb (72.1 kg)  05/07/23 162 lb 6.4 oz (73.7 kg)      Other studies Reviewed: Additional studies/ records that were reviewed today include: ***. Review of the above records demonstrates:  Please see elsewhere in the note.  ***   ASSESSMENT AND PLAN:  Elevated coronary calcium:  ***   AI:  ***   Current medicines are reviewed at length with the patient today.  The patient {ACTIONS; HAS/DOES NOT HAVE:19233} concerns regarding medicines.  The following changes have been made:  {PLAN; NO CHANGE:13088:s}  Labs/ tests ordered today include: *** No orders of the defined types were placed in this encounter.    Disposition:   FU with ***    Signed, Eilleen Grates, MD  11/04/2023 6:55 PM    [facility]

## 2023-11-05 ENCOUNTER — Ambulatory Visit: Admitting: Cardiology

## 2023-11-05 DIAGNOSIS — I351 Nonrheumatic aortic (valve) insufficiency: Secondary | ICD-10-CM

## 2023-11-05 DIAGNOSIS — R931 Abnormal findings on diagnostic imaging of heart and coronary circulation: Secondary | ICD-10-CM

## 2023-11-05 DIAGNOSIS — D219 Benign neoplasm of connective and other soft tissue, unspecified: Secondary | ICD-10-CM

## 2024-03-10 ENCOUNTER — Ambulatory Visit: Admitting: Internal Medicine

## 2024-03-10 DIAGNOSIS — F3176 Bipolar disorder, in full remission, most recent episode depressed: Secondary | ICD-10-CM | POA: Diagnosis not present

## 2024-03-10 DIAGNOSIS — F3174 Bipolar disorder, in full remission, most recent episode manic: Secondary | ICD-10-CM | POA: Diagnosis not present

## 2024-03-24 ENCOUNTER — Ambulatory Visit: Admitting: Orthopedic Surgery

## 2024-03-24 DIAGNOSIS — M1611 Unilateral primary osteoarthritis, right hip: Secondary | ICD-10-CM | POA: Diagnosis not present

## 2024-03-24 NOTE — Progress Notes (Signed)
 Return Patient Visit  Assessment: Bethany Espinoza is a 58 y.o. female with the following: Right hip arthritis Left hip arthritis; mild overall  Plan: Delon LITTIE Gaskins continues to have pain in the right hip.  Most recent injection was not as successful as prior injections.  However, she is not a position to consider surgery.  She is now looking into March or the spring before consideration for surgery.  She would like to proceed with another injection.  This was completed in clinic today.  She will follow-up as needed.  Procedure note injection - Right hip, ultrasound guidance   Verbal consent was obtained to inject the Right hip joint  Timeout was completed to confirm the site of injection.   Using the ultrasound, the femoral neck was identified.  The joint space was also identified. The skin was prepped with alcohol  and ethyl chloride was sprayed at the injection site.  A 21-gauge needle was used to inject 40 mg of Depo-Medrol  and 1% lidocaine  (4 cc) into the hip joint of the Right hip using a direct anterior approach.  The needle was visualized entering the hip joint, and the medication was also visualized. There were no complications.  A sterile bandage was applied.   Note: In order to accurately identify the placement of the needle, ultrasound was required, to increase the accuracy, and specificity of the injection.       Follow-up: Return if symptoms worsen or fail to improve.  Subjective:  Chief Complaint  Patient presents with   Injections    R hip    History of Present Illness: DEDRIA Espinoza is a 58 y.o. female who returns for evaluation of bilateral Hip pain.  She has known right hip arthritis.  She is unable to consider surgery at this time due to financial constraints.  She is now considering March or later in the spring.  Previous injection helped, but was not as beneficial as prior injections.  No new injuries.  Her left hip is not bothering her  currently.   Review of Systems: No fevers or chills No numbness or tingling No chest pain No shortness of breath No bowel or bladder dysfunction No GI distress No headaches      Objective: There were no vitals taken for this visit.  Physical Exam:  General: Alert and oriented. and No acute distress. Gait: Right sided antalgic gait.  Right hip without deformity.  No swelling.  5 degrees of internal rotation which is very painful.  15 degrees of external rotation.  Range of motion from 5-85 degrees.  Toes are warm and well-perfused.  2+ DP pulse.  Left hip without deformity.  Pain with internal and external rotation within the groin.  Toes are warm and well-perfused.   IMAGING: I personally reviewed images previously obtained in clinic  Severe right hip arthritis  Mild degenerative changes in her left hip.  New Medications:  No orders of the defined types were placed in this encounter.     Oneil DELENA Horde, MD  03/24/2024 10:42 AM

## 2024-03-25 ENCOUNTER — Other Ambulatory Visit: Payer: Self-pay | Admitting: Orthopedic Surgery

## 2024-04-27 ENCOUNTER — Other Ambulatory Visit: Payer: Self-pay | Admitting: Orthopedic Surgery

## 2024-04-27 ENCOUNTER — Telehealth: Payer: Self-pay | Admitting: Internal Medicine

## 2024-04-27 NOTE — Telephone Encounter (Signed)
 LVM for patient to call and discuss the 04/28/24 appointment with Dr. Marja may be closed due to inclement weather

## 2024-04-28 ENCOUNTER — Ambulatory Visit: Admitting: Internal Medicine

## 2024-06-08 ENCOUNTER — Encounter (INDEPENDENT_AMBULATORY_CARE_PROVIDER_SITE_OTHER): Payer: Self-pay | Admitting: *Deleted

## 2024-06-09 ENCOUNTER — Ambulatory Visit: Admitting: Internal Medicine

## 2024-06-09 DIAGNOSIS — F17210 Nicotine dependence, cigarettes, uncomplicated: Secondary | ICD-10-CM

## 2024-06-09 DIAGNOSIS — J449 Chronic obstructive pulmonary disease, unspecified: Secondary | ICD-10-CM

## 2024-06-09 NOTE — Progress Notes (Unsigned)
 "   Bethany Espinoza, female    DOB: 03-02-1966    MRN: [id number]   Brief patient profile:  18  yobf  active smoker  [name] pt self  referred back  to pulmonary clinic in [place]  07/22/2023  for GOLD 2 copd    CT chest with contrast 04/2014 showed mild emphysema LDCT chest 08/2022 >> RADS 2     PFTs 07/2022 >> FEV1  1.44 (51%) ratio 61  p 7% resp from saba with dlco 39%    History of Present Illness  07/22/2023  Pulmonary/ 1st office eval/ [name] / [name] out of [place]   x one month Chief Complaint  Patient presents with   New Patient (Initial Visit)  Dyspnea:  walking across parking lot intentionally / was better on breztri  despire poor hfa on today's eval  Cough: none recently  Sleep: bed is flat/ one pillow worse since ran out  SABA use: mostly at work  02: none  LDSCT:every April  Rec Plan A = Automatic = Always=    Breztri  Take 2 puffs first thing  when you wake up  and then another 2 puffs about 12 hours later.   Work on inhaler technique:   Plan B = Backup (to supplement plan A, not to replace it) Only use your albuterol  inhaler as a rescue medication  Plan C = Crisis (instead of Plan B but only if Plan B stops working) - only use your albuterol  nebulizer if you first try Plan B      10/29/2023  f/u ov/[place] office/[name] re: GOLD 2 COPD  maint on breztri   did not  bring meds   Dyspnea:  walking continously at work good pace  Cough: minimal attributed to pnds but wakes up with a smoker's rattle, nothing purulent  Sleeping: flat bed one pillow s  resp cc  SABA use: avg  02: ;none  Lung cancer screening: referred  > not done as of 06/09/2024  Rec No change rx   06/09/2024  f/u ov/[place] office/[name] re: GOLD 2 COPD maint on ***   lcs not done*** No chief complaint on file.   Dyspnea:  *** Cough: *** Sleeping: ***   resp cc  SABA use: *** 02: ***  Lung cancer screening: ***   No obvious day to day or daytime variability or assoc excess/ purulent  sputum or mucus plugs or hemoptysis or cp or chest tightness, subjective wheeze or overt sinus or hb symptoms.    Also denies any obvious fluctuation of symptoms with weather or environmental changes or other aggravating or alleviating factors except as outlined above   No unusual exposure hx or h/o childhood pna/ asthma or knowledge of premature birth.  Current Allergies, Complete Past Medical History, Past Surgical History, Family History, and Social History were reviewed in [employer] record.  ROS  The following are not active complaints unless bolded Hoarseness, sore throat, dysphagia, dental problems, itching, sneezing,  nasal congestion or discharge of excess mucus or purulent secretions, ear ache,   fever, chills, sweats, unintended wt loss or wt gain, classically pleuritic or exertional cp,  orthopnea pnd or arm/hand swelling  or leg swelling, presyncope, palpitations, abdominal pain, anorexia, nausea, vomiting, diarrhea  or change in bowel habits or change in bladder habits, change in stools or change in urine, dysuria, hematuria,  rash, arthralgias, visual complaints, headache, numbness, weakness or ataxia or problems with walking or coordination,  change in mood or  memory.  Outpatient Medications Prior to Visit  Medication Sig Dispense Refill   albuterol  (PROVENTIL ) (2.5 MG/3ML) 0.083% nebulizer solution Take 3 mLs (2.5 mg total) by nebulization every 6 (six) hours as needed for wheezing or shortness of breath. 120 mL 1   albuterol  (VENTOLIN  HFA) 108 (90 Base) MCG/ACT inhaler 2 puffs up to every 4 hours as needed if you can't catch your breath 18 g 11   amLODipine  (NORVASC ) 5 MG tablet Take 5 mg by mouth daily.     Budeson-Glycopyrrol-Formoterol (BREZTRI  AEROSPHERE) 160-9-4.8 MCG/ACT AERO Take 2 puffs first thing in am and then another 2 puffs about 12 hours later. 32.1 g 3   lamoTRIgine  (LAMICTAL ) 150 MG tablet Take 150 mg by mouth daily.       QUEtiapine  (SEROQUEL ) 25 MG tablet Take 25 mg by mouth at bedtime.     rosuvastatin (CRESTOR) 10 MG tablet Take 10 mg by mouth at bedtime.     traMADol  (ULTRAM ) 50 MG tablet TAKE 1 TABLET BY MOUTH EVERY 8 HOURS AS NEEDED 21 tablet 1   ziprasidone  (GEODON ) 40 MG capsule 1 capsule with food Orally once a day for 30 days     No facility-administered medications prior to visit.         Past Medical History:  Diagnosis Date   Asthma    COPD (chronic obstructive pulmonary disease) (HCC)    Fibroids    Hot flashes 02/27/2015   Hypertension    Mental disorder    bipolar, sees Dr Mitch   Vaginal dryness 02/27/2015      Objective:     Vital signs reviewed  06/09/2024  - Note at rest 02 sats  ***% on ***   General appearance:    ***    Mild barr***       Assessment                  "

## 2024-06-22 ENCOUNTER — Telehealth: Payer: Self-pay | Admitting: Orthopedic Surgery

## 2024-06-22 NOTE — Telephone Encounter (Signed)
 Dr. Onesimo pt - the pt lvm stating she would like to get an estimate of how much hip surgery would cost.  [phone]
